# Patient Record
Sex: Male | Born: 1935 | Race: Black or African American | Hispanic: No | Marital: Married | Smoking: Former smoker
Health system: Southern US, Community
[De-identification: ages and names within clinical notes are randomized; demographics above are authoritative.]

## PROBLEM LIST (undated history)

## (undated) DIAGNOSIS — IMO0001 Reserved for inherently not codable concepts without codable children: Secondary | ICD-10-CM

## (undated) DIAGNOSIS — Z974 Presence of external hearing-aid: Secondary | ICD-10-CM

## (undated) DIAGNOSIS — K219 Gastro-esophageal reflux disease without esophagitis: Secondary | ICD-10-CM

## (undated) DIAGNOSIS — M1990 Unspecified osteoarthritis, unspecified site: Secondary | ICD-10-CM

## (undated) DIAGNOSIS — Z972 Presence of dental prosthetic device (complete) (partial): Secondary | ICD-10-CM

## (undated) DIAGNOSIS — R42 Dizziness and giddiness: Secondary | ICD-10-CM

## (undated) DIAGNOSIS — J45909 Unspecified asthma, uncomplicated: Secondary | ICD-10-CM

## (undated) DIAGNOSIS — J189 Pneumonia, unspecified organism: Secondary | ICD-10-CM

## (undated) DIAGNOSIS — Z9109 Other allergy status, other than to drugs and biological substances: Secondary | ICD-10-CM

## (undated) DIAGNOSIS — I10 Essential (primary) hypertension: Secondary | ICD-10-CM

## (undated) HISTORY — PX: HERNIA REPAIR: SHX51

## (undated) HISTORY — PX: KNEE ARTHROSCOPY: SHX127

## (undated) HISTORY — PX: CYSTOSCOPY: SUR368

## (undated) HISTORY — PX: BACK SURGERY: SHX140

---

## 1998-01-31 ENCOUNTER — Emergency Department (HOSPITAL_COMMUNITY): Admission: EM | Admit: 1998-01-31 | Discharge: 1998-01-31 | Payer: Self-pay | Admitting: Emergency Medicine

## 1998-11-18 ENCOUNTER — Emergency Department (HOSPITAL_COMMUNITY): Admission: EM | Admit: 1998-11-18 | Discharge: 1998-11-18 | Payer: Self-pay | Admitting: Emergency Medicine

## 2001-11-24 ENCOUNTER — Encounter: Payer: Self-pay | Admitting: Emergency Medicine

## 2001-11-24 ENCOUNTER — Emergency Department (HOSPITAL_COMMUNITY): Admission: EM | Admit: 2001-11-24 | Discharge: 2001-11-24 | Payer: Self-pay | Admitting: Emergency Medicine

## 2002-01-06 ENCOUNTER — Encounter: Payer: Self-pay | Admitting: Family Medicine

## 2002-01-06 ENCOUNTER — Emergency Department (HOSPITAL_COMMUNITY): Admission: EM | Admit: 2002-01-06 | Discharge: 2002-01-06 | Payer: Self-pay | Admitting: Emergency Medicine

## 2003-01-07 ENCOUNTER — Ambulatory Visit (HOSPITAL_COMMUNITY): Admission: RE | Admit: 2003-01-07 | Discharge: 2003-01-07 | Payer: Self-pay | Admitting: Gastroenterology

## 2003-01-07 ENCOUNTER — Encounter (INDEPENDENT_AMBULATORY_CARE_PROVIDER_SITE_OTHER): Payer: Self-pay | Admitting: Specialist

## 2005-10-19 ENCOUNTER — Emergency Department (HOSPITAL_COMMUNITY): Admission: EM | Admit: 2005-10-19 | Discharge: 2005-10-19 | Payer: Self-pay | Admitting: Emergency Medicine

## 2006-09-09 ENCOUNTER — Encounter (INDEPENDENT_AMBULATORY_CARE_PROVIDER_SITE_OTHER): Payer: Self-pay | Admitting: *Deleted

## 2006-09-09 ENCOUNTER — Inpatient Hospital Stay (HOSPITAL_COMMUNITY): Admission: RE | Admit: 2006-09-09 | Discharge: 2006-09-11 | Payer: Self-pay

## 2007-11-29 ENCOUNTER — Emergency Department (HOSPITAL_COMMUNITY): Admission: EM | Admit: 2007-11-29 | Discharge: 2007-11-29 | Payer: Self-pay | Admitting: Emergency Medicine

## 2009-01-18 ENCOUNTER — Emergency Department (HOSPITAL_COMMUNITY): Admission: EM | Admit: 2009-01-18 | Discharge: 2009-01-18 | Payer: Self-pay | Admitting: Emergency Medicine

## 2009-01-23 ENCOUNTER — Ambulatory Visit (HOSPITAL_COMMUNITY): Admission: EM | Admit: 2009-01-23 | Discharge: 2009-01-24 | Payer: Self-pay | Admitting: Emergency Medicine

## 2009-01-24 ENCOUNTER — Encounter (INDEPENDENT_AMBULATORY_CARE_PROVIDER_SITE_OTHER): Payer: Self-pay | Admitting: Gastroenterology

## 2011-01-20 LAB — CBC
HCT: 39.5 % (ref 39.0–52.0)
Hemoglobin: 13.7 g/dL (ref 13.0–17.0)
MCHC: 34.8 g/dL (ref 30.0–36.0)
RDW: 13.8 % (ref 11.5–15.5)

## 2011-01-20 LAB — DIFFERENTIAL
Basophils Absolute: 0 10*3/uL (ref 0.0–0.1)
Basophils Relative: 1 % (ref 0–1)
Eosinophils Relative: 4 % (ref 0–5)
Monocytes Absolute: 0.4 10*3/uL (ref 0.1–1.0)

## 2011-01-20 LAB — POCT CARDIAC MARKERS: Troponin i, poc: <0.05 ng/mL (ref 0.00–0.09)

## 2011-02-23 NOTE — Consult Note (Signed)
NAME:  Stephen Hawkins, Stephen Hawkins NO.:  [id number]   MEDICAL RECORD NO.:  [id number]          PATIENT TYPE:  EMS   LOCATION:  ED                           FACILITY:  [facility]   PHYSICIAN:  Pedro Earls, MD     DATE OF BIRTH:  03-Jan-1931   DATE OF CONSULTATION:  01/18/2009  DATE OF DISCHARGE:                                 CONSULTATION   REQUESTING PHYSICIAN:  The ER.   REASON FOR CONSULTATION:  Shortness of breath.   HISTORY OF PRESENT ILLNESS:  This is a 77 year old African American male  patient with a past medical history significant for asthma who was  apparently asymptomatic approximately a week ago when he started doing  some gardening and some yard work.  Since then the patient started  having some cough and some shortness of breath and wheezing.  The  patient had been taking his albuterol inhaler 2 puffs as needed and  Singulair but it is not helping at this point and the patient started  coughing and bringing up yellow expectoration as well.  The patient does  not normally have  seasonal allergies, does not smoke and has never had  any acute attack in the past which required hospitalization.  The patient denies any nausea, vomiting, any fever or chills, any chest  pain.   REVIEW OF SYSTEMS:  As above.  The rest of the review of systems are  negative.   PAST MEDICAL HISTORY:  1. Degenerative joint disease.  2. Asthma.   PAST SURGICAL HISTORY:  1. Hemorrhoidectomy.  2. Prostatectomy.  3. Hernia repair.   SOCIAL HISTORY:  Nonsmoker, nonalcoholic, no IV drug abuser.   FAMILY HISTORY:  Noncontributory.   MEDICATIONS:  1. Singulair 10 mg daily.  2. Albuterol 2 puffs as needed.   ALLERGIES:  NO KNOWN DRUG ALLERGIES.Marland Kitchen   PHYSICAL EXAMINATION:  VITAL SIGNS:  Temperature 98.3, blood pressure  133/61, pulse is 67-70, respirations 20, pulse ox 96-98% on 2 liters.  GENERAL:  The patient is awake, alert, oriented x3.  Does not appear to  be in acute distress.  HEENT:  Pupils equal, round, reactive to light.  No icterus, no pallor.  Extraocular movements are intact.  Oral mucosa is moist.  NECK:  Supple.  No JVD, no lymphadenopathy.  HEART:  S1 and S2, regular.  CHEST:  Clear.  Minimal wheezing present.  ABDOMEN:  Soft, obese, nontender.  Bowel sounds present.  No  hepatosplenomegaly.  EXTREMITIES:  Peripheral pulses present.  No clubbing, cyanosis or  edema.  CNS:  Sensory grossly intact.  Cranial nerves II-XII intact.  SKIN: No rashes.  MUSCULOSKELETAL:  Unremarkable.   LABORATORY DATA:  Chest x-ray negative.  EKG shows normal sinus rhythm,  no acute ST-T wave changes were seen.  Last CBC is within normal range.  Troponin less than 0.05.   IMPRESSION:  Acute asthmatic bronchitis.   RECOMMENDATIONS:  1. The patient should be given some breathing treatment in the ER.  I      would recommend that the patient can be discharged home after      breathing  treatment has been done and the patient needs to follow      with the PCP on January 20, 2009.  2. Recommend Levaquin 750 p.o. daily for 5 days with Medrol Dosepak      along with Advair 110/50 1 puff b.i.d.   Thank you for this consultation.      Pedro Earls, MD  Electronically Signed     NS/MEDQ  D:  01/18/2009  T:  01/18/2009  Job:  [id number]   cc:   Lorelle Formosa, M.D.  Fax: [phone]

## 2011-02-23 NOTE — Consult Note (Signed)
NAME:  Stephen Hawkins, Stephen Hawkins NO.:  [id number]   MEDICAL RECORD NO.:  [id number]          PATIENT TYPE:  EMS   LOCATION:  ED                           FACILITY:  [facility]   PHYSICIAN:  Jordan Hawks. Elnoria Howard, MD    DATE OF BIRTH:  1931-09-30   DATE OF CONSULTATION:  01/24/2009  DATE OF DISCHARGE:                                 CONSULTATION   REASON FOR CONSULTATION:  Food impaction.   HISTORY OF PRESENT ILLNESS:  This is a 77 year old gentleman with past  medical history of asthma, who reports having an acute onset of  dysphagia.  He was eating a piece of rib, and unfortunately at that time  he did not have his dentures in.  He was not able to expectorate his  food bolus before chewing it properly.  Subsequently it was lodged in  his esophagus.  Since that time he has not been able to manage his  secretions, and subsequently presented to the emergency room for further  evaluation and treatment.  No prior reports of gastroesophageal reflux  disease and no prior history of dysphagia in the past.   PAST MEDICAL HISTORY:  As stated above.   PAST SURGICAL HISTORY:  As stated above.   FAMILY HISTORY:  Noncontributory.   SOCIAL HISTORY:  Negative for alcohol, tobacco or illicit drug use.   HOME MEDICATIONS:  1. Albuterol.  2. Singulair.   ALLERGIES:  No known drug allergies.   REVIEW OF SYSTEMS:  As stated above in the history of present illness,  otherwise negative.   PHYSICAL EXAMINATION:  VITAL SIGNS:  Stable.  GENERAL:  The patient is in no acute distress, alert and oriented.  HEENT:  Normocephalic and atraumatic.  Extraocular muscles intact.  NECK:  Supple.  No lymphadenopathy.  LUNGS:  Clear to auscultation bilaterally.  CARDIOVASCULAR:  Regular rate and rhythm.  ABDOMEN:  Obese, soft, nontender and nondistended.  Positive bowel  sounds.  EXTREMITIES:  No clubbing, cyanosis or edema.   IMPRESSION:  Acute food impaction:  The patient has a negative history  of  reflux and no prior dysphagia symptoms.  At this time I will perform  an endoscopy for further evaluation and remove the food bolus.  Further  recommendations pending the findings.      Jordan Hawks Elnoria Howard, MD  Electronically Signed     PDH/MEDQ  D:  01/24/2009  T:  01/24/2009  Job:  [id number]

## 2011-02-26 NOTE — Op Note (Signed)
NAME:  Stephen Hawkins, Stephen Hawkins NO.:  [id number]   MEDICAL RECORD NO.:  [id number]          PATIENT TYPE:  INP   LOCATION:  0002                         FACILITY:  [facility]   PHYSICIAN:  Lindaann Slough, M.D.  DATE OF BIRTH:  1930/12/17   DATE OF PROCEDURE:  09/09/2006  DATE OF DISCHARGE:                               OPERATIVE REPORT   PREOPERATIVE DIAGNOSES:  1. Benign prostatic hypertrophy.  2. Bladder outlet obstruction.  3. Left inguinal hernia.   POSTOPERATIVE DIAGNOSES:  1. Benign prostatic hypertrophy.  2. Bladder outlet obstruction.  3. Left inguinal hernia.   PROCEDURE DONE:  1. Cystoscopy.  2. TURP.   SURGEON:  Dr. Brunilda Payor   ANESTHESIA:  Spinal.   INDICATION:  The patient is a 77 year old male, who has been complaining  of increasing symptoms of frequency, urgency, decreased force of the  urinary strean, nocturia x2-3.  He has been on Flomax and Avodart, and  he has not had any improvement of the symptoms.  He had a TUNA procedure  on July 29, 2003.  The patient was found on examination to have a  left inguinal hernia.  He is scheduled for cystoscopy, TURP, and also  left inguinal hernia repair by Dr. Orson Slick.   Under spinal anesthesia, the patient was prepped and draped and placed  in the dorsal lithotomy position.  A #22 Wappler cystoscope was inserted  in the bladder.  The patient has trilobar prostatic hypertrophy with  obstruction of the bladder neck.  He has a large median lobe.  The  bladder is trabeculated with cellules.  There is no stone or tumor in  the bladder.  The ureteral orifices are in normal position and shape.  The cystoscope was removed.  The urethra was dilated up to #30 [place]  with Sissy Hoff sounds.  Then, a #28 continuous-flow Iglesias  resectoscope was inserted in the bladder.  Resection of the median lobe  was done first.  Then resection of the lateral lobes was done between  the 7 and the 10 o'clock position and between the 2  o'clock and the 5  o'clock position  using the bladder neck  and the verumontanum as  landmarks.  Then, the resection was completed between the 10 o'clock and  the 2 o'clock positions and between the 5 o'clock and 7o'clock positions  using the same landmarks.  Hemostasis was secured with electrocautery.  The prostatic chips were then irrigated out of the bladder.  There was  minimal bleeding  at the end of the procedure.  The resectoscope was then removed.  A #24  three-way Foley catheter was inserted in the bladder.   The patient tolerated the procedure well and left the OR in satisfactory  condition to postanesthesia care unit.      Lindaann Slough, M.D.  Electronically Signed     MN/MEDQ  D:  09/09/2006  T:  09/10/2006  Job:  [id number]   cc:   Lebron Conners, M.D.  [address]  [place]  [place] [address]

## 2011-02-26 NOTE — Op Note (Signed)
NAME:  Stephen Hawkins, Stephen Hawkins NO.:  [id number]   MEDICAL RECORD NO.:  [id number]          PATIENT TYPE:  INP   LOCATION:  0002                         FACILITY:  [facility]   PHYSICIAN:  Lebron Conners, M.D.   DATE OF BIRTH:  Nov 18, 1930   DATE OF PROCEDURE:  09/09/2006  DATE OF DISCHARGE:                               OPERATIVE REPORT   PREOPERATIVE DIAGNOSIS:  Left inguinal hernia   POSTOPERATIVE DIAGNOSIS:  Indirect left inguinal hernia.   SURGEON:  Lebron Conners, M.D.   ANESTHESIA:  Spinal and local.   ESTIMATED BLOOD LOSS:  Minimal.   COMPLICATIONS:  None.   SPECIMEN:  None.   PROCEDURE:  After the patient was monitored and had adequate spinal  anesthesia and routine preparation and draping of the left inguinal  region using Hibiclens, I made an oblique incision about 7 cm in length  beginning just above and lateral to the pubic tubercle and dissecting  down through the fat to the external oblique.  I identified the  superficial inguinal ring and opened the fibers of the external oblique  in the direction of their travel into the superficial ring exposing  expanded spermatic cord.  I identified the ilioinguinal nerve and spared  it from harm.  I dissected down around the cord at the level of the  pubic tubercle and encircled it with a Penrose drain and lifted it up.  I then cut the cremaster fibers along the medial inguinal floor until I  had the cord coming straight out of the deep ring.  I saw a slight  tendency for weakness of the medial floor but there was no actual  hernia.  I then dissected the proximal aspect of the cord, finding a  large hernia sac and large amount of fat associated with it.  I  separated that from the vas deferens and vessels of the cord.  I reduced  it through the deep ring and held it in place with a 2-0 Vicryl stitch  placed generously in the internal oblique to hold it in place and  tighten the superior aspect of the deep ring.  I  then fashioned a piece  of polypropylene mesh cut with a slit in it to allow the spermatic cord  to exit from the belly wall into the scrotum and I sewed that in place  beginning at the pubic tubercle and sewing medially and superiorly with  running basting 2-0 Prolene stitch and laterally and inferiorly with  running simple stitch in the shelving edge of the inguinal ligament.  I  used two Prolene stitches lateral to the deep ring to join the tails of  the mesh together completing the repair.  I then generously anesthetized  the operative area with long acting local anesthetic.  I closed the  external oblique with running 2-0 Vicryl and closed subcutaneous tissues  with running 3-0  Vicryl and closed the skin with intracuticular 4-0 Vicryl and Steri-  Strips.  The sponge, needle and instrument counts were correct.  The  patient was stable throughout the procedure and remained in the  operating room  for Dr. Brunilda Payor to do a urological procedure.      Lebron Conners, M.D.  Electronically Signed     WB/MEDQ  D:  09/09/2006  T:  09/09/2006  Job:  [id number]

## 2011-02-26 NOTE — Op Note (Signed)
NAME:  Stephen Hawkins, Stephen Hawkins                        ACCOUNT NO.:  [id number]   MEDICAL RECORD NO.:  [id number]                   PATIENT TYPE:  AMB   LOCATION:  ENDO                                 FACILITY:  [facility]   PHYSICIAN:  Petra Kuba, M.D.                 DATE OF BIRTH:  August 08, 1931   DATE OF PROCEDURE:  01/07/2003  DATE OF DISCHARGE:                                 OPERATIVE REPORT   PROCEDURE PERFORMED:  Colonoscopy.   INDICATIONS FOR PROCEDURE:  Bright red blood per rectum in a patient due for  colonic screening.   Consent was signed after the risks, benefits, methods and options were  thoroughly discussed in the office.   PREMEDICATION:  Demerol 80, Versed 8.   DESCRIPTION OF PROCEDURE:  The rectal was inspected.  It was remarkable for  internal hemorrhoids, small.  A digital exam was negative.  The video  colonoscope was inserted, easily advanced around the colon to the cecum.  On  insertion, a moderate sized pedunculated proximal sigmoid polyp was seen but  no other obvious abnormalities.  The cecum was identified by the appendiceal  orifice and the ileocecal valve.  The scope was slowly withdrawn.  The prep  was adequate.  There was some liquid stool that required washing and  suctioning on slow withdrawal.  The cecum and ascending were normal.  In the  hepatic flexure, a tiny small polyp was seen and was hot biopsied x1.  There  was a rare right and left-sided diverticula that was seen as we slowly  withdrew back to the polyp seen on insertion which was seen, snared,  electrocautery applied and the polyp was removed.  It was resnared,  suctioned onto the head of the scope and the polyp was withdrawn.  The scope  was reinserted to the level of the polypectomy site.  The stalk had a nice  white coagulum without any obvious residual polyps.  The scope was further  withdrawn.  No additional findings were seen.  Once back in the rectum, the  scope was retroflexed.   There were some internal hemorrhoids.  The scope was straightened and re-  advanced a ways up the left side of the colon.  The air was suctioned.  The  scope was removed.  The patient tolerated the procedure well.  There were no  obvious immediate complications.   ENDOSCOPIC DIAGNOSES:  1. Internal and external hemorrhoids.  2. Rare left and right diverticula.  3. Moderate sigmoid proximal polyp status post snare.  4. Tiny hepatic flexure polyp, hot biopsied.  5. Otherwise within normal limits to the cecum.    PLAN:  Await pathology but probably recheck colon screening in three years,  happy to see back p.r.n., otherwise see back in about two months to recheck  symptoms, possibly guaiacs and make sure no further work up plans are  needed.  Petra Kuba, M.D.    MEM/MEDQ  D:  01/07/2003  T:  01/07/2003  Job:  [id number]   cc:   Lorelle Formosa, M.D.  [phone]  [place]  [place] [address]  Fax: [phone]

## 2011-02-26 NOTE — H&P (Signed)
NAME:  Stephen Hawkins, GOELLER NO.:  [id number]   MEDICAL RECORD NO.:  [id number]          PATIENT TYPE:  INP   LOCATION:  0002                         FACILITY:  [facility]   PHYSICIAN:  Lindaann Slough, M.D.  DATE OF BIRTH:  20-Oct-1930   DATE OF ADMISSION:  09/09/2006  DATE OF DISCHARGE:                              HISTORY & PHYSICAL   CHIEF COMPLAINT:  Frequency, urgency, hesitancy, decreased force of  urinary stream.   HISTORY OF PRESENT ILLNESS:  The patient is a 77 year old male who has  been having increasing symptoms of frequency, urgency, hesitancy,  decreased force of urinary stream.  He has been on Flomax  2 capsules  and Avodart, and he has not had any improvement of his symptoms.  He had  a TUNA procedure on July 29, 2003.  The patient is admitted for TURP.   PAST MEDICAL HISTORY:  Positive for asthma.   MEDICATIONS:  Albuterol, Clarinex, Singulair, doxazosin, Avodart 0.5 mg.   ALLERGIES:  NO KNOWN DRUG ALLERGIES.   PAST SURGICAL HISTORY:  Right knee surgery in 1956.   SOCIAL HISTORY:  He is married, has 5 children.  Quit smoking in 1970  and had smoked for about 5 years, does not drink.   FAMILY HISTORY:  Positive for hypertension, heart disease.  One brother  died of prostate cancer.  He had 4 brothers, all deceased, and he had 5  sisters, 2 of whom are deceased.   REVIEW OF SYSTEMS:  Positive for frequency, decreased force of the  urinary stream, nocturia x3, urgency, and all others are negative.   PHYSICAL EXAMINATION:  GENERAL:  This is a well-built 77 year old male  who is in no acute distress.  He is oriented to time, place and person.  VITAL SIGNS:  Blood pressure is 123/77, pulse 64, respirations 20, and  temperature 97.5.  HEENT:  Normal.  Pupils are equal and reactive to light and  accommodation.  Ears, nose, and throat within normal limits.  He has  pink conjunctivae.  He has no cervical adenopathy, no thyromegaly.  LUNGS:  Clear.  HEART:  Regular rate.  ABDOMEN:  Soft, nondistended, nontender.  He has no CVA tenderness.  Kidneys are not palpable.  He has no hepatomegaly, no splenomegaly.  Bladder is not distended.  There is a left inguinal hernia, and bowel  sounds are normal.  GENITALIA:  Penis and scrotal contents are within normal limits.  RECTAL:  Sphincter tone is normal.  Prostate is enlarged 40 g, nodules.  Seminal vesicles not palpable.   IMPRESSION:  1. Benign prostatic hypertrophy.  2. Bladder outlet obstruction.  3. Left inguinal hernia.  4. Asthma.      Lindaann Slough, M.D.  Electronically Signed     MN/MEDQ  D:  09/09/2006  T:  09/10/2006  Job:  [id number]

## 2012-04-07 ENCOUNTER — Ambulatory Visit (INDEPENDENT_AMBULATORY_CARE_PROVIDER_SITE_OTHER): Payer: TRICARE For Life (TFL) | Admitting: Internal Medicine

## 2012-04-07 ENCOUNTER — Ambulatory Visit: Payer: BC Managed Care – PPO

## 2012-04-07 VITALS — BP 166/69 | HR 73 | Temp 98.5°F | Resp 18 | Ht 70.5 in | Wt 242.0 lb

## 2012-04-07 DIAGNOSIS — M25569 Pain in unspecified knee: Secondary | ICD-10-CM

## 2012-04-07 DIAGNOSIS — H6120 Impacted cerumen, unspecified ear: Secondary | ICD-10-CM

## 2012-04-07 DIAGNOSIS — M1990 Unspecified osteoarthritis, unspecified site: Secondary | ICD-10-CM

## 2012-04-07 NOTE — Progress Notes (Signed)
  Subjective:    Patient ID: Stephen Hawkins, male    DOB: 12/13/1930, 78 y.o.   MRN: [id number]  HPIPatient of Dr. Jeri Cos Sent here by Dr. Bascom Levels for an injection of his knee Complaining of increased pain in the knee with ambulation over the last 6-12 months/there is occasional swelling/never red/has history of right knee problems and needed arthroscopy at 1 point     Review of SystemsDenies any health problems except allergic rhinitis and asthma/both stable No fever or chills no history of gout No arthritis in other joints     Objective:   Physical Exam Blood pressure 166/69 Right knee is puffy/very tender over medial joint line/mildly tender over lateral joint line/has pain with full flexion/okay full extension/varus and valgus are stable/anterior drawer stable/there is no redness  Procedure= with sterile technique,After 2 cc of extra-articular lidocaine, aspiration was attempted and was a dry tap Injected with 1 cc Kenalog 40 mg +3 cc of lidocaine Ace to compress      [facility] reading (PRIMARY) by  Dr.Latayvia Mandujano=Narrow at the medial joint line   Assessment & Plan:  Problem #1 osteoarthritis knee  Postinjection care discussed with advancing of weightbearing activities over the next wee Followup with Dr. Bascom Levels

## 2012-04-07 NOTE — Patient Instructions (Addendum)
Rest the knee over the next 24 hours Apply ice for 30 minutes every 2 hours for the rest of today If there is any redness or swelling we need to reevaluate your knee at once Use Tylenol or Aleve for discomfort over the next 48 hours

## 2012-04-08 ENCOUNTER — Encounter: Payer: Self-pay | Admitting: Internal Medicine

## 2012-06-27 ENCOUNTER — Emergency Department (INDEPENDENT_AMBULATORY_CARE_PROVIDER_SITE_OTHER)
Admission: EM | Admit: 2012-06-27 | Discharge: 2012-06-27 | Disposition: A | Discharge status: Nursing Home (Includes ICF) | Payer: BC Managed Care – PPO | Source: Home / Self Care | Attending: Family Medicine | Admitting: Family Medicine

## 2012-06-27 ENCOUNTER — Emergency Department (INDEPENDENT_AMBULATORY_CARE_PROVIDER_SITE_OTHER): Payer: BC Managed Care – PPO

## 2012-06-27 ENCOUNTER — Observation Stay (HOSPITAL_COMMUNITY)
Admission: EM | Admit: 2012-06-27 | Discharge: 2012-06-29 | DRG: 143 | Disposition: A | Discharge status: Home / Self Care | Payer: BC Managed Care – PPO | Attending: Internal Medicine | Admitting: Internal Medicine

## 2012-06-27 ENCOUNTER — Encounter (HOSPITAL_COMMUNITY): Payer: Self-pay | Admitting: *Deleted

## 2012-06-27 DIAGNOSIS — M25519 Pain in unspecified shoulder: Secondary | ICD-10-CM | POA: Insufficient documentation

## 2012-06-27 DIAGNOSIS — M25512 Pain in left shoulder: Secondary | ICD-10-CM

## 2012-06-27 DIAGNOSIS — M542 Cervicalgia: Secondary | ICD-10-CM | POA: Insufficient documentation

## 2012-06-27 DIAGNOSIS — R609 Edema, unspecified: Secondary | ICD-10-CM | POA: Insufficient documentation

## 2012-06-27 DIAGNOSIS — R0600 Dyspnea, unspecified: Secondary | ICD-10-CM

## 2012-06-27 DIAGNOSIS — K219 Gastro-esophageal reflux disease without esophagitis: Secondary | ICD-10-CM | POA: Diagnosis present

## 2012-06-27 DIAGNOSIS — R001 Bradycardia, unspecified: Secondary | ICD-10-CM | POA: Diagnosis present

## 2012-06-27 DIAGNOSIS — J45909 Unspecified asthma, uncomplicated: Secondary | ICD-10-CM | POA: Diagnosis present

## 2012-06-27 DIAGNOSIS — R079 Chest pain, unspecified: Principal | ICD-10-CM | POA: Diagnosis present

## 2012-06-27 DIAGNOSIS — E876 Hypokalemia: Secondary | ICD-10-CM | POA: Diagnosis present

## 2012-06-27 DIAGNOSIS — R0602 Shortness of breath: Secondary | ICD-10-CM | POA: Insufficient documentation

## 2012-06-27 DIAGNOSIS — R1012 Left upper quadrant pain: Secondary | ICD-10-CM | POA: Insufficient documentation

## 2012-06-27 DIAGNOSIS — R0609 Other forms of dyspnea: Secondary | ICD-10-CM

## 2012-06-27 DIAGNOSIS — I498 Other specified cardiac arrhythmias: Secondary | ICD-10-CM | POA: Insufficient documentation

## 2012-06-27 DIAGNOSIS — I10 Essential (primary) hypertension: Secondary | ICD-10-CM | POA: Diagnosis present

## 2012-06-27 DIAGNOSIS — Z87891 Personal history of nicotine dependence: Secondary | ICD-10-CM | POA: Insufficient documentation

## 2012-06-27 HISTORY — DX: Unspecified asthma, uncomplicated: J45.909

## 2012-06-27 LAB — CBC
HCT: 36.2 % — ABNORMAL LOW (ref 39.0–52.0)
MCH: 29.7 pg (ref 26.0–34.0)
MCHC: 34.3 g/dL (ref 30.0–36.0)
MCV: 86.6 fL (ref 78.0–100.0)
RDW: 13.6 % (ref 11.5–15.5)

## 2012-06-27 LAB — CK TOTAL AND CKMB (NOT AT ARMC)
Relative Index: 1.6 (ref 0.0–2.5)
Total CK: 523 U/L — ABNORMAL HIGH (ref 7–232)

## 2012-06-27 LAB — CBC WITH DIFFERENTIAL/PLATELET
Basophils Relative: 0 % (ref 0–1)
HCT: 37.3 % — ABNORMAL LOW (ref 39.0–52.0)
Hemoglobin: 12.9 g/dL — ABNORMAL LOW (ref 13.0–17.0)
MCH: 29.9 pg (ref 26.0–34.0)
MCHC: 34.6 g/dL (ref 30.0–36.0)
Monocytes Absolute: 0.5 10*3/uL (ref 0.1–1.0)
Monocytes Relative: 10 % (ref 3–12)
Neutro Abs: 2.1 10*3/uL (ref 1.7–7.7)

## 2012-06-27 LAB — BASIC METABOLIC PANEL
BUN: 12 mg/dL (ref 6–23)
Chloride: 105 mEq/L (ref 96–112)
Creatinine, Ser: 0.92 mg/dL (ref 0.50–1.35)
GFR calc Af Amer: >90 mL/min (ref >90)

## 2012-06-27 LAB — CREATININE, SERUM: GFR calc non Af Amer: 77 mL/min — ABNORMAL LOW (ref >90)

## 2012-06-27 LAB — PRO B NATRIURETIC PEPTIDE: Pro B Natriuretic peptide (BNP): 60.9 pg/mL (ref 0–450)

## 2012-06-27 MED ORDER — LISINOPRIL 10 MG PO TABS
10.0000 mg | ORAL_TABLET | Freq: Every day | ORAL | Status: DC
  Administered 2012-06-27: 10 mg via ORAL
  Filled 2012-06-27 (×2): qty 1

## 2012-06-27 MED ORDER — HYDROCODONE-ACETAMINOPHEN 5-325 MG PO TABS: 1.0000 | ORAL_TABLET | ORAL | Status: DC | PRN

## 2012-06-27 MED ORDER — SODIUM CHLORIDE 0.9 % IV SOLN: 250.0000 mL | INTRAVENOUS | Status: DC | PRN

## 2012-06-27 MED ORDER — SODIUM CHLORIDE 0.9 % IJ SOLN
3.0000 mL | Freq: Two times a day (BID) | INTRAMUSCULAR | Status: DC
  Administered 2012-06-27 – 2012-06-28 (×3): 3 mL via INTRAVENOUS

## 2012-06-27 MED ORDER — SODIUM CHLORIDE 0.9 % IV SOLN
Freq: Once | INTRAVENOUS | Status: AC
  Administered 2012-06-27: 16:00:00 via INTRAVENOUS

## 2012-06-27 MED ORDER — ASPIRIN 325 MG PO TABS
325.0000 mg | ORAL_TABLET | Freq: Once | ORAL | Status: AC
  Administered 2012-06-27: 325 mg via ORAL
  Filled 2012-06-27: qty 1

## 2012-06-27 MED ORDER — IPRATROPIUM BROMIDE 0.02 % IN SOLN
0.5000 mg | Freq: Four times a day (QID) | RESPIRATORY_TRACT | Status: DC
  Administered 2012-06-27: 0.5 mg via RESPIRATORY_TRACT
  Filled 2012-06-27: qty 2.5

## 2012-06-27 MED ORDER — ENOXAPARIN SODIUM 40 MG/0.4ML ~~LOC~~ SOLN
40.0000 mg | SUBCUTANEOUS | Status: DC
  Administered 2012-06-27 – 2012-06-28 (×2): 40 mg via SUBCUTANEOUS
  Filled 2012-06-27 (×3): qty 0.4

## 2012-06-27 MED ORDER — MONTELUKAST SODIUM 10 MG PO TABS
10.0000 mg | ORAL_TABLET | Freq: Every day | ORAL | Status: DC
  Administered 2012-06-27 – 2012-06-28 (×2): 10 mg via ORAL
  Filled 2012-06-27 (×3): qty 1

## 2012-06-27 MED ORDER — SODIUM CHLORIDE 0.9 % IJ SOLN: 3.0000 mL | INTRAMUSCULAR | Status: DC | PRN

## 2012-06-27 MED ORDER — ASPIRIN EC 325 MG PO TBEC
325.0000 mg | DELAYED_RELEASE_TABLET | Freq: Every day | ORAL | Status: DC
  Administered 2012-06-28 – 2012-06-29 (×2): 325 mg via ORAL
  Filled 2012-06-27 (×3): qty 1

## 2012-06-27 MED ORDER — ALBUTEROL SULFATE (5 MG/ML) 0.5% IN NEBU
2.5000 mg | INHALATION_SOLUTION | Freq: Four times a day (QID) | RESPIRATORY_TRACT | Status: DC
  Administered 2012-06-27: 2.5 mg via RESPIRATORY_TRACT
  Filled 2012-06-27: qty 0.5

## 2012-06-27 MED ORDER — HYDROCHLOROTHIAZIDE 12.5 MG PO CAPS
12.5000 mg | ORAL_CAPSULE | Freq: Every day | ORAL | Status: DC
  Administered 2012-06-27: 12.5 mg via ORAL
  Filled 2012-06-27 (×2): qty 1

## 2012-06-27 MED ORDER — POTASSIUM CHLORIDE CRYS ER 20 MEQ PO TBCR
40.0000 meq | EXTENDED_RELEASE_TABLET | Freq: Once | ORAL | Status: AC
  Administered 2012-06-27: 40 meq via ORAL
  Filled 2012-06-27: qty 2

## 2012-06-27 MED ORDER — SODIUM CHLORIDE 0.9 % IJ SOLN
INTRAMUSCULAR | Status: AC
  Filled 2012-06-27: qty 3

## 2012-06-27 MED ORDER — PANTOPRAZOLE SODIUM 40 MG PO TBEC
40.0000 mg | DELAYED_RELEASE_TABLET | Freq: Every day | ORAL | Status: DC
  Administered 2012-06-27 – 2012-06-29 (×3): 40 mg via ORAL
  Filled 2012-06-27 (×3): qty 1

## 2012-06-27 MED ORDER — NITROGLYCERIN 0.4 MG SL SUBL: 0.4000 mg | SUBLINGUAL_TABLET | SUBLINGUAL | Status: DC | PRN

## 2012-06-27 MED ORDER — ALBUTEROL SULFATE HFA 108 (90 BASE) MCG/ACT IN AERS
4.0000 | INHALATION_SPRAY | RESPIRATORY_TRACT | Status: DC | PRN
  Administered 2012-06-27: 4 via RESPIRATORY_TRACT
  Filled 2012-06-27: qty 6.7

## 2012-06-27 MED ORDER — ALUM & MAG HYDROXIDE-SIMETH 200-200-20 MG/5ML PO SUSP
30.0000 mL | Freq: Four times a day (QID) | ORAL | Status: AC
  Administered 2012-06-27 – 2012-06-28 (×3): 30 mL via ORAL
  Filled 2012-06-27 (×3): qty 30

## 2012-06-27 NOTE — ED Notes (Signed)
Report called to charge nurse - gems otw

## 2012-06-27 NOTE — ED Provider Notes (Signed)
History     CSN: [id number]  Arrival date & time 06/27/12  1334   First MD Initiated Contact with Patient 06/27/12 1339      Chief Complaint  Patient presents with  . Chest Pain    (Consider location/radiation/quality/duration/timing/severity/associated sxs/prior treatment) Patient is a 78 y.o. male presenting with chest pain. The history is provided by the patient.  Chest Pain The chest pain began 5 - 7 days ago. Chest pain occurs constantly. The pain is associated with breathing. The pain is currently at 5/10. The quality of the pain is described as tightness. The pain radiates to the left neck. Chest pain is worsened by deep breathing. Primary symptoms include shortness of breath and wheezing. Associated symptoms comments: Reflux burping sx but no complete resolution of sx. Past medical history comments: asthma     Past Medical History  Diagnosis Date  . Asthma     Past Surgical History  Procedure Date  . Hernia repair   . Knee arthroscopy     knee surgery in the army    History reviewed. No pertinent family history.  History  Substance Use Topics  . Smoking status: Former Games developer  . Smokeless tobacco: Not on file  . Alcohol Use: No      Review of Systems  Constitutional: Negative.   Respiratory: Positive for shortness of breath and wheezing.   Cardiovascular: Positive for chest pain.    Allergies  Review of patient's allergies indicates no known allergies.  Home Medications   No current outpatient prescriptions on file.  BP 163/79  Pulse 70  Temp 97.7 F (36.5 C) (Oral)  Resp 20  SpO2 97%  Physical Exam  Nursing note and vitals reviewed. Constitutional: He is oriented to person, place, and time. He appears well-developed and well-nourished.  HENT:  Head: Normocephalic.  Eyes: Pupils are equal, round, and reactive to light.  Neck: Normal range of motion. Neck supple.  Cardiovascular: Normal rate, regular rhythm, normal heart sounds and intact  distal pulses.   Pulmonary/Chest: Effort normal. He has decreased breath sounds. He has no rales.  Abdominal: Bowel sounds are normal.  Lymphadenopathy:    He has no cervical adenopathy.  Neurological: He is alert and oriented to person, place, and time.  Skin: Skin is warm and dry.    ED Course  Procedures (including critical care time)  Labs Reviewed - No data to display Dg Chest 2 View  06/27/2012  *RADIOLOGY REPORT*  Clinical Data: Left upper quadrant pain.  Shortness of breath.  CHEST - 2 VIEW  Comparison: Two-view chest 01/18/2009.  Findings: The heart size is upper limits of normal.  Lungs are hyperinflated.  There is a linear atelectasis are present at the lung bases.  The upper lung fields are clear.  The visualized soft tissues and bony thorax are unremarkable.  IMPRESSION:  1.  Hyperinflation compatible with emphysema. 2.  Areas of linear atelectasis are present at the lung bases bilaterally.   Original Report Authenticated By: Jamesetta Orleans. MATTERN, M.D.      1. Dyspnea and respiratory abnormalities       MDM  X-rays reviewed and report per radiologist.         Linna Hoff, MD 06/28/12 2006

## 2012-06-27 NOTE — ED Provider Notes (Signed)
History     CSN: [id number]  Arrival date & time 06/27/12  1554   First MD Initiated Contact with Patient 06/27/12 1613      Chief Complaint  Patient presents with  . Chest Pain    (Consider location/radiation/quality/duration/timing/severity/associated sxs/prior treatment) Patient is a 78 y.o. male presenting with chest pain. The history is provided by the patient.  Chest Pain The chest pain began more than 2 weeks ago. Episode Length: 3-4. Chest pain occurs constantly. The chest pain is unchanged. The pain is associated with eating and exertion. At its most intense, the pain is at 5/10. The pain is currently at 8/10. The severity of the pain is mild. The quality of the pain is described as aching and dull. Radiates to: left neck, left shoulder. Primary symptoms include shortness of breath (mild). Pertinent negatives for primary symptoms include no fever, no cough, no abdominal pain, no nausea and no vomiting.  Pertinent negatives for associated symptoms include no numbness. He tried nothing for the symptoms. Risk factors: FH.     Past Medical History  Diagnosis Date  . Asthma     History reviewed. No pertinent past surgical history.  No family history on file.  History  Substance Use Topics  . Smoking status: Former Games developer  . Smokeless tobacco: Not on file  . Alcohol Use: No      Review of Systems  Constitutional: Negative for fever.  HENT: Negative for rhinorrhea, drooling and neck pain.   Eyes: Negative for pain.  Respiratory: Positive for shortness of breath (mild). Negative for cough.   Cardiovascular: Positive for chest pain. Negative for leg swelling.  Gastrointestinal: Negative for nausea, vomiting, abdominal pain and diarrhea.  Genitourinary: Negative for dysuria and hematuria.  Musculoskeletal: Negative for gait problem.  Skin: Negative for color change.  Neurological: Negative for numbness and headaches.  Hematological: Negative for adenopathy.    Psychiatric/Behavioral: Negative for behavioral problems.  All other systems reviewed and are negative.    Allergies  Review of patient's allergies indicates no known allergies.  Home Medications   Current Outpatient Rx  Name Route Sig Dispense Refill  . ALBUTEROL SULFATE HFA 108 (90 BASE) MCG/ACT IN AERS Inhalation Inhale 2 puffs into the lungs every 6 (six) hours as needed. For wheezing    . IBUPROFEN 200 MG PO TABS Oral Take 400 mg by mouth daily as needed. For pain    . MONTELUKAST SODIUM 10 MG PO TABS Oral Take 10 mg by mouth at bedtime.      BP 159/68  Temp 98.3 F (36.8 C) (Oral)  Resp 16  SpO2 99%  Physical Exam  Nursing note and vitals reviewed. Constitutional: He is oriented to person, place, and time. He appears well-developed and well-nourished.  HENT:  Head: Normocephalic and atraumatic.  Right Ear: External ear normal.  Left Ear: External ear normal.  Nose: Nose normal.  Mouth/Throat: Oropharynx is clear and moist. No oropharyngeal exudate.  Eyes: Conjunctivae normal and EOM are normal. Pupils are equal, round, and reactive to light.  Neck: Normal range of motion. Neck supple.  Cardiovascular: Normal rate, regular rhythm, normal heart sounds and intact distal pulses.  Exam reveals no gallop and no friction rub.   No murmur heard. Pulmonary/Chest: Effort normal. No respiratory distress. He has wheezes (mild wheezing bilaterally in lung bases).  Abdominal: Soft. Bowel sounds are normal. He exhibits no distension. There is no tenderness. There is no rebound and no guarding.  Musculoskeletal: Normal range of motion.  He exhibits edema (mild nonpitting edema in bilatearl LE's). He exhibits no tenderness.  Neurological: He is alert and oriented to person, place, and time.  Skin: Skin is warm and dry.  Psychiatric: He has a normal mood and affect. His behavior is normal.    ED Course  Procedures (including critical care time)   Labs Reviewed  CBC WITH  DIFFERENTIAL  BASIC METABOLIC PANEL  TROPONIN I   Dg Chest 2 View  06/27/2012  *RADIOLOGY REPORT*  Clinical Data: Left upper quadrant pain.  Shortness of breath.  CHEST - 2 VIEW  Comparison: Two-view chest 01/18/2009.  Findings: The heart size is upper limits of normal.  Lungs are hyperinflated.  There is a linear atelectasis are present at the lung bases.  The upper lung fields are clear.  The visualized soft tissues and bony thorax are unremarkable.  IMPRESSION:  1.  Hyperinflation compatible with emphysema. 2.  Areas of linear atelectasis are present at the lung bases bilaterally.   Original Report Authenticated By: Jamesetta Orleans. MATTERN, M.D.      No diagnosis found.   Date: 06/27/2012  Rate: 46  Rhythm: sinus bradycardia  QRS Axis: normal  Intervals: normal  ST/T Wave abnormalities: normal  Conduction Disutrbances:none  Narrative Interpretation: No new ST or T wave changes cw ischemia.   Old EKG Reviewed: unchanged    MDM  4:42 PM 78 y.o. male w hx of COPD pw constant cp for several weeks. Pt notes pain worse w/ eating and walking up hill. Pt seen at urgent care today, reported pain worse w/ breathing then, but denies this here. Pt AFVSS here, appears well on exam, 5/10 pain. Will perform cp workup, will give ASA/nitro.   Will admit to hospitalist for cp r/o.   Clinical Impression 1. Chest pain   2. Asthma   3. Bradycardia   4. Hypertension   5. Hypokalemia            Purvis Sheffield, MD 06/28/12 0022

## 2012-06-27 NOTE — ED Notes (Signed)
Pt reports chest pain fairly constant for the past week , slight pain on left side of neck. Denies n/v/d. Pt reports " when I take a deep breath it doesn't feel right."

## 2012-06-27 NOTE — ED Notes (Signed)
EKG completed at 1600 and given to Dr. Bernette Mayers along with OLD ekg.

## 2012-06-27 NOTE — ED Notes (Signed)
Per EMS-pt reports left constant dull chest pain that has been ongoing for 1 week. Pt reports SOB. Pt was seen at urgent care today. Chest xray was done as well. BP 160/90. 20 g in L hand

## 2012-06-27 NOTE — ED Notes (Signed)
I saw and evaluated the patient, reviewed the resident's note and I agree with the findings and plan.  Agree with EKG interpretation  Pt with CAD risk factors, having chest pain. Admit for rule out.   Menashe Kafer B. Bernette Mayers, MD 06/27/12 2337

## 2012-06-27 NOTE — H&P (Signed)
PCP:   Billee Cashing, MD   Chief Complaint:  Chest pain  HPI: 78 y/o male with h/o asthma who came to the hospital with chest pain, located left side. He also has chronic pain in the left shoulder. He says that pain is also on the left side of neck. Patient does no have h./o CAD. He is hypertensive in the Er but does not take anti hypertensive medications. He admits to chest pain on exertion. He denies nausea, vomiting or diarrhea.  Allergies:  No Known Allergies    Past Medical History  Diagnosis Date  . Asthma     History reviewed. No pertinent past surgical history.  Prior to Admission medications   Medication Sig Start Date End Date Taking? Authorizing Provider  albuterol (PROVENTIL HFA;VENTOLIN HFA) 108 (90 BASE) MCG/ACT inhaler Inhale 2 puffs into the lungs every 6 (six) hours as needed. For wheezing   Yes Historical Provider, MD  ibuprofen (ADVIL,MOTRIN) 200 MG tablet Take 400 mg by mouth daily as needed. For pain   Yes Historical Provider, MD  montelukast (SINGULAIR) 10 MG tablet Take 10 mg by mouth at bedtime.   Yes Historical Provider, MD    Social History:  reports that he has quit smoking. He does not have any smokeless tobacco history on file. He reports that he does not drink alcohol or use illicit drugs.  No family history on file.  Review of Systems:  HEENT: Denies headache, blurred vision, runny nose, sore throat,  Neck: Denies thyroid problems,lymphadenopathy Chest : Positive h/o asthma Heart : Denies  coronary arterey disease GI: Denies  nausea, vomiting, diarrhea, constipation GU: Denies dysuria, urgency, frequency of urination, hematuria Neuro: Denies stroke, seizures, syncope Psych: Denies depression, anxiety, hallucinations   Physical Exam: Blood pressure 165/83, temperature 98.3 F (36.8 C), temperature source Oral, resp. rate 17, SpO2 99.00%. Constitutional:   Patient is a well-developed and well-nourished  male in no acute distress and  cooperative with exam. Head: Normocephalic and atraumatic Mouth: Mucus membranes moist Eyes: PERRL, EOMI, conjunctivae normal Neck: Supple, No Thyromegaly Cardiovascular: RRR, S1 normal, S2 normal Pulmonary/Chest: Scattered wheezing, bibasilar crackles, decreased breath sounds bilaterally Abdominal: Soft. Non-tender, non-distended, bowel sounds are normal, no masses, organomegaly, or guarding present.  Neurological: A&O x3, Strenght is normal and symmetric bilaterally, cranial nerve II-XII are grossly intact, no focal motor deficit, sensory intact to light touch bilaterally.  Extremities : Trace edema bilaterally   Labs on Admission:  Results for orders placed during the hospital encounter of 06/27/12 (from the past 48 hour(s))  CBC WITH DIFFERENTIAL     Status: Abnormal   Collection Time   06/27/12  4:42 PM      Component Value Range Comment   WBC 4.9  4.0 - 10.5 K/uL    RBC 4.31  4.22 - 5.81 MIL/uL    Hemoglobin 12.9 (*) 13.0 - 17.0 g/dL    HCT 16.1 (*) 09.6 - 52.0 %    MCV 86.5  78.0 - 100.0 fL    MCH 29.9  26.0 - 34.0 pg    MCHC 34.6  30.0 - 36.0 g/dL    RDW 04.5  40.9 - 81.1 %    Platelets 192  150 - 400 K/uL    Neutrophils Relative 44  43 - 77 %    Neutro Abs 2.1  1.7 - 7.7 K/uL    Lymphocytes Relative 43  12 - 46 %    Lymphs Abs 2.1  0.7 - 4.0 K/uL  Monocytes Relative 10  3 - 12 %    Monocytes Absolute 0.5  0.1 - 1.0 K/uL    Eosinophils Relative 2  0 - 5 %    Eosinophils Absolute 0.1  0.0 - 0.7 K/uL    Basophils Relative 0  0 - 1 %    Basophils Absolute 0.0  0.0 - 0.1 K/uL   BASIC METABOLIC PANEL     Status: Abnormal   Collection Time   06/27/12  4:42 PM      Component Value Range Comment   Sodium 141  135 - 145 mEq/L    Potassium 3.4 (*) 3.5 - 5.1 mEq/L    Chloride 105  96 - 112 mEq/L    CO2 28  19 - 32 mEq/L    Glucose, Bld 79  70 - 99 mg/dL    BUN 12  6 - 23 mg/dL    Creatinine, Ser 4.54  0.50 - 1.35 mg/dL    Calcium 9.1  8.4 - 09.8 mg/dL    GFR calc non Af  Amer 78 (*) >90 mL/min    GFR calc Af Amer >90  >90 mL/min   TROPONIN I     Status: Normal   Collection Time   06/27/12  4:42 PM      Component Value Range Comment   Troponin I <0.30  <0.30 ng/mL     Radiological Exams on Admission: Dg Chest 2 View  06/27/2012  *RADIOLOGY REPORT*  Clinical Data: Left upper quadrant pain.  Shortness of breath.  CHEST - 2 VIEW  Comparison: Two-view chest 01/18/2009.  Findings: The heart size is upper limits of normal.  Lungs are hyperinflated.  There is a linear atelectasis are present at the lung bases.  The upper lung fields are clear.  The visualized soft tissues and bony thorax are unremarkable.  IMPRESSION:  1.  Hyperinflation compatible with emphysema. 2.  Areas of linear atelectasis are present at the lung bases bilaterally.   Original Report Authenticated By: Jamesetta Orleans. MATTERN, M.D.     Assessment/Plan Active Problems:  Chest pain Asthma Hypertension Bradycardia Hypokalemia  Chest pain Will admit to telemetry, and three sets of cardiac enzymes. Will continue on aspirin. Check lipid panel in am.  Asthma Patient has scattered wheezing, will continue with duoneb nebulizer  Hypertension Patient has untreated hypertension. Will check 2D echo in am. Will start HCTZ and Lisinopril.  ? Diastolic dysfunction Patient has bibasilar crackles, will check BNP. Follow 2D echo.   Bradycardia EKG shows nonspecific t wave changes, monitor on tele.   Hypokalemia Replace potassium  DVT prophylaxis Lovenox  Time Spent on Admission: 50 min  [place] S [facility] Pager: [phone] 06/27/2012, 6:46 PM

## 2012-06-28 ENCOUNTER — Encounter (HOSPITAL_COMMUNITY): Payer: Self-pay | Admitting: *Deleted

## 2012-06-28 DIAGNOSIS — M25512 Pain in left shoulder: Secondary | ICD-10-CM | POA: Diagnosis present

## 2012-06-28 DIAGNOSIS — K219 Gastro-esophageal reflux disease without esophagitis: Secondary | ICD-10-CM | POA: Diagnosis present

## 2012-06-28 LAB — LIPID PANEL
Cholesterol: 166 mg/dL (ref 0–200)
Total CHOL/HDL Ratio: 4.4 RATIO

## 2012-06-28 LAB — COMPREHENSIVE METABOLIC PANEL
AST: 21 U/L (ref 0–37)
Albumin: 3.3 g/dL — ABNORMAL LOW (ref 3.5–5.2)
Alkaline Phosphatase: 36 U/L — ABNORMAL LOW (ref 39–117)
Chloride: 102 mEq/L (ref 96–112)
Potassium: 3.6 mEq/L (ref 3.5–5.1)
Sodium: 139 mEq/L (ref 135–145)
Total Bilirubin: 0.3 mg/dL (ref 0.3–1.2)
Total Protein: 6.5 g/dL (ref 6.0–8.3)

## 2012-06-28 LAB — CBC
HCT: 37.3 % — ABNORMAL LOW (ref 39.0–52.0)
MCH: 30.3 pg (ref 26.0–34.0)
MCV: 87.6 fL (ref 78.0–100.0)
Platelets: 193 10*3/uL (ref 150–400)
RDW: 13.6 % (ref 11.5–15.5)
WBC: 3.8 10*3/uL — ABNORMAL LOW (ref 4.0–10.5)

## 2012-06-28 LAB — TROPONIN I: Troponin I: <0.3 ng/mL (ref <0.30)

## 2012-06-28 MED ORDER — ALBUTEROL SULFATE (5 MG/ML) 0.5% IN NEBU
2.5000 mg | INHALATION_SOLUTION | Freq: Two times a day (BID) | RESPIRATORY_TRACT | Status: DC
  Administered 2012-06-28 (×2): 2.5 mg via RESPIRATORY_TRACT
  Filled 2012-06-28 (×2): qty 0.5

## 2012-06-28 MED ORDER — IPRATROPIUM BROMIDE 0.02 % IN SOLN
0.5000 mg | Freq: Two times a day (BID) | RESPIRATORY_TRACT | Status: DC
  Administered 2012-06-28 (×2): 0.5 mg via RESPIRATORY_TRACT
  Filled 2012-06-28 (×2): qty 2.5

## 2012-06-28 MED ORDER — IPRATROPIUM BROMIDE 0.02 % IN SOLN
0.5000 mg | Freq: Four times a day (QID) | RESPIRATORY_TRACT | Status: DC | PRN
  Administered 2012-06-29: 0.5 mg via RESPIRATORY_TRACT
  Filled 2012-06-28 (×2): qty 2.5

## 2012-06-28 MED ORDER — ALBUTEROL SULFATE (5 MG/ML) 0.5% IN NEBU
2.5000 mg | INHALATION_SOLUTION | Freq: Four times a day (QID) | RESPIRATORY_TRACT | Status: DC | PRN
  Administered 2012-06-29: 2.5 mg via RESPIRATORY_TRACT
  Filled 2012-06-28 (×2): qty 0.5

## 2012-06-28 MED ORDER — LISINOPRIL 20 MG PO TABS: 20.0000 mg | ORAL_TABLET | Freq: Every day | ORAL | Status: DC

## 2012-06-28 MED ORDER — TRAMADOL HCL 50 MG PO TABS: 25.0000 mg | ORAL_TABLET | Freq: Four times a day (QID) | ORAL | Status: DC | PRN

## 2012-06-28 MED ORDER — LISINOPRIL 20 MG PO TABS
20.0000 mg | ORAL_TABLET | Freq: Every day | ORAL | Status: DC
  Administered 2012-06-28 – 2012-06-29 (×2): 20 mg via ORAL
  Filled 2012-06-28 (×2): qty 1

## 2012-06-28 NOTE — Progress Notes (Signed)
Pt c/o sob this am. Pt vitals checked bp 149/81 , 97%RA. Pt sob resolved. Will continue to monitor.

## 2012-06-28 NOTE — Progress Notes (Signed)
[facility] PROGRESS NOTE  Stephen Hawkins:[id number] DOB: 1931-09-11 DOA: 06/27/2012 PCP: Billee Cashing, MD  Assessment/Plan: Active Problems:  Chest pain  Asthma  Hypertension  Bradycardia  Hypokalemia    1. Chest pain:   Patient presented with chest pain, described as "like a feeling of indigestion', occuring mainly after meals, and relieved by belching, now going on for 1-2 weeks. On occasion, he has had exertional chest discomfort. According to him, he gets these episodes, about 5 times per day. 12-lead EKG revealed sinus bradycardia, but no acute ischemic changes. Cardiac enzymes remained unelevated, and patient has had no arrhythmias on telemetric monitoring. On Aspirin. Lipid panel shows TC 166, TG 114, HDL 38 and LDL 105.  Chest pain sounds atypical, and more consistent with GI etiology, particularly against a background of prn NSAID use. Have placed patient on PPI, but in view of age, gender, and HTN, stratification is warranted. Have consulted Dr Sharyn Lull, cardiologist.   2. Asthma:  Patient has a known history of bronchial asthma. No clinical evidence of acute exacerbation at this time. Managing with bronchodilators. Stable. 3. Hypertension:   Hypertension documented on initial presentation. Patient has no known previous history of HTN, and was not on antihypertensives, pre-admission. ProBNP is normal at 60.9. Started on HCTZ and Lisinopril at presentation. 2D echocardiogram is pending. BP is still sub-optimally controlled today, so have increased Lisinopril to 20 mg daily. Will discontinue HCTZ.   4. Bradycardia:  Patient has a bradycardia, with HR 53-67. EKG showed SR. Will check TSH. 5. Hypokalemia:  Repleting as indicated.  6. Left shoulder DJD:  Patient has had pain in the left shoulder, for the last 6 months, without antecedent trauma, for which he takes Advil prn. ROM is limited by pain, particularly on shoulder elevation and internal rotation. Clinical  findings are consistent with DJD. Analgesics have been recommended, but may need to avoid NSAIDS, due to possible GERD symptoms.    Code Status: Full Code. Family Communication:  Disposition Plan: To be determined.   Brief narrative: 78 y/o male with h/o asthma, benign prostatic hypertrophy/bladder outlet obstruction, s/p TURP 08/2006, left inguinal hernia s/p repair 08/2006, int/ext hemorrhoid, previous sigmoid polyp, and chronic left shoulder DJD/pain, presenting with exertional left-sided chest pain, radiating to the left side of his neck. He was found to be hypertensive in the ED. Admitted for further evaluation and management.   Consultants:  Dr Rinaldo Cloud, Cardiologist.   Procedures:  CXR.  Antibiotics:  N/A.   HPI/Subjective: Has occasional belching, otherwise, asymptomatic.   Objective: Vital signs in last 24 hours: Temp:  [97.7 F (36.5 C)-98.7 F (37.1 C)] 98 F (36.7 C) (09/18 0500) Pulse Rate:  [51-70] 53  (09/18 0500) Resp:  [13-20] 18  (09/18 0500) BP: (120-186)/(63-87) 149/81 mmHg (09/18 0635) SpO2:  [96 %-99 %] 96 % (09/18 0838) Weight change:  Last BM Date: 06/27/12  Intake/Output from previous day:       Physical Exam: General: Comfortable, alert, communicative, fully oriented, not short of breath at rest.  HEENT:  No clinical pallor, no jaundice, no conjunctival injection or discharge. Hydration is satisfactory.  NECK:  Supple, JVP not seen, no carotid bruits, no palpable lymphadenopathy, no palpable goiter. CHEST:  Clinically clear to auscultation, no wheezes, no crackles. HEART:  Sounds 1 and 2 heard, normal, regular, no murmurs. ABDOMEN:  Full, soft, non-tender, no palpable organomegaly, no palpable masses, normal bowel sounds. GENITALIA:  Not examined. LOWER EXTREMITIES:  No pitting edema, palpable peripheral pulses.  MUSCULOSKELETAL SYSTEM:  Generalized osteoarthritic changes, has decreased ROM left shoulder, secondary to pain,  otherwise, normal. CENTRAL NERVOUS SYSTEM:  No focal neurologic deficit on gross examination.  Lab Results:  Basename 06/28/12 0725 06/27/12 1929  WBC 3.8* 5.1  HGB 12.9* 12.4*  HCT 37.3* 36.2*  PLT 193 195    Basename 06/27/12 1929 06/27/12 1642  NA -- 141  K -- 3.4*  CL -- 105  CO2 -- 28  GLUCOSE -- 79  BUN -- 12  CREATININE 0.95 0.92  CALCIUM -- 9.1   No results found for this or any previous visit (from the past 240 hour(s)).   Studies/Results: Dg Chest 2 View  06/27/2012  *RADIOLOGY REPORT*  Clinical Data: Left upper quadrant pain.  Shortness of breath.  CHEST - 2 VIEW  Comparison: Two-view chest 01/18/2009.  Findings: The heart size is upper limits of normal.  Lungs are hyperinflated.  There is a linear atelectasis are present at the lung bases.  The upper lung fields are clear.  The visualized soft tissues and bony thorax are unremarkable.  IMPRESSION:  1.  Hyperinflation compatible with emphysema. 2.  Areas of linear atelectasis are present at the lung bases bilaterally.   Original Report Authenticated By: Jamesetta Orleans. MATTERN, M.D.     Medications: Scheduled Meds:   . albuterol  2.5 mg Nebulization BID  . alum & mag hydroxide-simeth  30 mL Oral Q6H  . aspirin EC  325 mg Oral Daily  . aspirin  325 mg Oral Once  . enoxaparin (LOVENOX) injection  40 mg Subcutaneous Q24H  . hydrochlorothiazide  12.5 mg Oral Daily  . ipratropium  0.5 mg Nebulization BID  . lisinopril  10 mg Oral Daily  . montelukast  10 mg Oral QHS  . pantoprazole  40 mg Oral Q1200  . potassium chloride  40 mEq Oral Once  . sodium chloride  3 mL Intravenous Q12H  . DISCONTD: albuterol  2.5 mg Nebulization Q6H  . DISCONTD: ipratropium  0.5 mg Nebulization Q6H   Continuous Infusions:  PRN Meds:.sodium chloride, albuterol, HYDROcodone-acetaminophen, ipratropium, nitroGLYCERIN, sodium chloride, DISCONTD: albuterol    LOS: 1 day   Kebrina Friend,CHRISTOPHER  [facility] Pager [phone]. If 8PM-8AM,  please contact night-coverage at [email or web address], password [facility] 06/28/2012, 8:48 AM  LOS: 1 day

## 2012-06-28 NOTE — Progress Notes (Signed)
*  PRELIMINARY RESULTS* Echocardiogram 2D Echocardiogram has been performed.  Stephen Hawkins 06/28/2012, 2:57 PM

## 2012-06-28 NOTE — Progress Notes (Signed)
Utilization review completed.  

## 2012-06-28 NOTE — Consult Note (Signed)
Reason for Consult: Chest pain Referring Physician: [facility]  Stephen Hawkins is an 78 y.o. male.  HPI: Patient is 78 year old male with past medical history significant for bronchial asthma/COPD history of benign hypertrophia of prostate, hypertension on no medications, was admitted because of left-sided chest pain radiating to neck and left shoulder states chest pain occurs after eating food half to one hour relieved with burping. Also complains of shoulder and neck pain which increases with movement. Patient denies any history of exertional chest pain but gives history of exertional dyspnea associated with feeling weak and tired patient denies any nausea vomiting diaphoresis. Denies PND orthopnea leg swelling. Denies any recent cardiac workup. Denies any cough fever chills.  Past Medical History  Diagnosis Date  . Asthma     Past Surgical History  Procedure Date  . Hernia repair   . Knee arthroscopy     knee surgery in the army    History reviewed. No pertinent family history.  Social History:  reports that he has quit smoking. He does not have any smokeless tobacco history on file. He reports that he does not drink alcohol or use illicit drugs.  Allergies: No Known Allergies  Medications: I have reviewed the patient's current medications.  Results for orders placed during the hospital encounter of 06/27/12 (from the past 48 hour(s))  CBC WITH DIFFERENTIAL     Status: Abnormal   Collection Time   06/27/12  4:42 PM      Component Value Range Comment   WBC 4.9  4.0 - 10.5 K/uL    RBC 4.31  4.22 - 5.81 MIL/uL    Hemoglobin 12.9 (*) 13.0 - 17.0 g/dL    HCT 04.5 (*) 40.9 - 52.0 %    MCV 86.5  78.0 - 100.0 fL    MCH 29.9  26.0 - 34.0 pg    MCHC 34.6  30.0 - 36.0 g/dL    RDW 81.1  91.4 - 78.2 %    Platelets 192  150 - 400 K/uL    Neutrophils Relative 44  43 - 77 %    Neutro Abs 2.1  1.7 - 7.7 K/uL    Lymphocytes Relative 43  12 - 46 %    Lymphs Abs 2.1  0.7 - 4.0 K/uL     Monocytes Relative 10  3 - 12 %    Monocytes Absolute 0.5  0.1 - 1.0 K/uL    Eosinophils Relative 2  0 - 5 %    Eosinophils Absolute 0.1  0.0 - 0.7 K/uL    Basophils Relative 0  0 - 1 %    Basophils Absolute 0.0  0.0 - 0.1 K/uL   BASIC METABOLIC PANEL     Status: Abnormal   Collection Time   06/27/12  4:42 PM      Component Value Range Comment   Sodium 141  135 - 145 mEq/L    Potassium 3.4 (*) 3.5 - 5.1 mEq/L    Chloride 105  96 - 112 mEq/L    CO2 28  19 - 32 mEq/L    Glucose, Bld 79  70 - 99 mg/dL    BUN 12  6 - 23 mg/dL    Creatinine, Ser 9.56  0.50 - 1.35 mg/dL    Calcium 9.1  8.4 - 21.3 mg/dL    GFR calc non Af Amer 78 (*) >90 mL/min    GFR calc Af Amer >90  >90 mL/min   TROPONIN I  Status: Normal   Collection Time   06/27/12  4:42 PM      Component Value Range Comment   Troponin I <0.30  <0.30 ng/mL   TROPONIN I     Status: Normal   Collection Time   06/27/12  7:28 PM      Component Value Range Comment   Troponin I <0.30  <0.30 ng/mL   CK TOTAL AND CKMB     Status: Abnormal   Collection Time   06/27/12  7:28 PM      Component Value Range Comment   Total CK 523 (*) 7 - 232 U/L    CK, MB 8.6 (*) 0.3 - 4.0 ng/mL    Relative Index 1.6  0.0 - 2.5   PRO B NATRIURETIC PEPTIDE     Status: Normal   Collection Time   06/27/12  7:28 PM      Component Value Range Comment   Pro B Natriuretic peptide (BNP) 60.9  0 - 450 pg/mL   CBC     Status: Abnormal   Collection Time   06/27/12  7:29 PM      Component Value Range Comment   WBC 5.1  4.0 - 10.5 K/uL    RBC 4.18 (*) 4.22 - 5.81 MIL/uL    Hemoglobin 12.4 (*) 13.0 - 17.0 g/dL    HCT 16.1 (*) 09.6 - 52.0 %    MCV 86.6  78.0 - 100.0 fL    MCH 29.7  26.0 - 34.0 pg    MCHC 34.3  30.0 - 36.0 g/dL    RDW 04.5  40.9 - 81.1 %    Platelets 195  150 - 400 K/uL   CREATININE, SERUM     Status: Abnormal   Collection Time   06/27/12  7:29 PM      Component Value Range Comment   Creatinine, Ser 0.95  0.50 - 1.35 mg/dL    GFR calc non  Af Amer 77 (*) >90 mL/min    GFR calc Af Amer 89 (*) >90 mL/min   TROPONIN I     Status: Normal   Collection Time   06/28/12  1:17 AM      Component Value Range Comment   Troponin I <0.30  <0.30 ng/mL   COMPREHENSIVE METABOLIC PANEL     Status: Abnormal   Collection Time   06/28/12  7:25 AM      Component Value Range Comment   Sodium 139  135 - 145 mEq/L    Potassium 3.6  3.5 - 5.1 mEq/L    Chloride 102  96 - 112 mEq/L    CO2 30  19 - 32 mEq/L    Glucose, Bld 94  70 - 99 mg/dL    BUN 12  6 - 23 mg/dL    Creatinine, Ser 9.14  0.50 - 1.35 mg/dL    Calcium 8.9  8.4 - 78.2 mg/dL    Total Protein 6.5  6.0 - 8.3 g/dL    Albumin 3.3 (*) 3.5 - 5.2 g/dL    AST 21  0 - 37 U/L    ALT 18  0 - 53 U/L    Alkaline Phosphatase 36 (*) 39 - 117 U/L    Total Bilirubin 0.3  0.3 - 1.2 mg/dL    GFR calc non Af Amer 76 (*) >90 mL/min    GFR calc Af Amer 89 (*) >90 mL/min   CBC     Status: Abnormal   Collection Time  06/28/12  7:25 AM      Component Value Range Comment   WBC 3.8 (*) 4.0 - 10.5 K/uL    RBC 4.26  4.22 - 5.81 MIL/uL    Hemoglobin 12.9 (*) 13.0 - 17.0 g/dL    HCT 47.8 (*) 29.5 - 52.0 %    MCV 87.6  78.0 - 100.0 fL    MCH 30.3  26.0 - 34.0 pg    MCHC 34.6  30.0 - 36.0 g/dL    RDW 62.1  30.8 - 65.7 %    Platelets 193  150 - 400 K/uL   TROPONIN I     Status: Normal   Collection Time   06/28/12  7:25 AM      Component Value Range Comment   Troponin I <0.30  <0.30 ng/mL   LIPID PANEL     Status: Abnormal   Collection Time   06/28/12  7:25 AM      Component Value Range Comment   Cholesterol 166  0 - 200 mg/dL    Triglycerides 846  <962 mg/dL    HDL 38 (*) >95 mg/dL    Total CHOL/HDL Ratio 4.4      VLDL 23  0 - 40 mg/dL    LDL Cholesterol 284 (*) 0 - 99 mg/dL     Dg Chest 2 View  1/32/4401  *RADIOLOGY REPORT*  Clinical Data: Left upper quadrant pain.  Shortness of breath.  CHEST - 2 VIEW  Comparison: Two-view chest 01/18/2009.  Findings: The heart size is upper limits of normal.   Lungs are hyperinflated.  There is a linear atelectasis are present at the lung bases.  The upper lung fields are clear.  The visualized soft tissues and bony thorax are unremarkable.  IMPRESSION:  1.  Hyperinflation compatible with emphysema. 2.  Areas of linear atelectasis are present at the lung bases bilaterally.   Original Report Authenticated By: Jamesetta Orleans. MATTERN, M.D.     Review of Systems  Constitutional: Negative for fever and chills.  Eyes: Negative for blurred vision.  Respiratory: Negative for cough and sputum production.   Cardiovascular: Positive for chest pain. Negative for palpitations, orthopnea, claudication and leg swelling.  Gastrointestinal: Positive for heartburn. Negative for nausea, vomiting and abdominal pain.  Genitourinary: Negative for dysuria.  Neurological: Negative for dizziness and headaches.   Blood pressure 149/81, pulse 53, temperature 98 F (36.7 C), temperature source Oral, resp. rate 18, height 6' (1.829 m), weight 110.678 kg (244 lb), SpO2 96.00%. Physical Exam  Constitutional: He is oriented to person, place, and time. He appears well-developed and well-nourished.  HENT:  Head: Normocephalic and atraumatic.  Nose: Nose normal.  Mouth/Throat: No oropharyngeal exudate.  Eyes: Conjunctivae normal are normal. Pupils are equal, round, and reactive to light. Left eye exhibits no discharge. No scleral icterus.  Neck: Neck supple. No JVD present. No tracheal deviation present. No thyromegaly present.  Cardiovascular:       Bradycardic S1-S2 normal soft systolic and diastolic murmur left lower sternal border  Respiratory: Effort normal and breath sounds normal. No respiratory distress. He has no wheezes. He has no rales.  GI: Soft. Bowel sounds are normal. He exhibits no distension. There is no tenderness. There is no rebound.  Musculoskeletal: He exhibits no edema and no tenderness.  Neurological: He is alert and oriented to person, place, and time.     Assessment/Plan: Atypical chest pain rule out MI Exertional dyspnea rule out coronary insufficiency Hypertension History of bronchial asthma/COPD Remote  tobacco abuse History of benign hypertrophia of prostate status post TURP Degenerative joint disease Plan Continue present management We'll schedule for nuclear stress test in a.m.  Robynn Pane 06/28/2012, 5:26 PM

## 2012-06-29 ENCOUNTER — Inpatient Hospital Stay (HOSPITAL_COMMUNITY): Payer: BC Managed Care – PPO

## 2012-06-29 DIAGNOSIS — K219 Gastro-esophageal reflux disease without esophagitis: Secondary | ICD-10-CM

## 2012-06-29 LAB — BASIC METABOLIC PANEL
BUN: 15 mg/dL (ref 6–23)
CO2: 30 mEq/L (ref 19–32)
GFR calc non Af Amer: 76 mL/min — ABNORMAL LOW (ref >90)
Glucose, Bld: 100 mg/dL — ABNORMAL HIGH (ref 70–99)
Potassium: 3.9 mEq/L (ref 3.5–5.1)
Sodium: 142 mEq/L (ref 135–145)

## 2012-06-29 MED ORDER — TECHNETIUM TC 99M SESTAMIBI - CARDIOLITE
30.0000 | Freq: Once | INTRAVENOUS | Status: AC | PRN
  Administered 2012-06-29: 30 via INTRAVENOUS

## 2012-06-29 MED ORDER — ASPIRIN 325 MG PO TBEC: 325.0000 mg | DELAYED_RELEASE_TABLET | Freq: Every day | ORAL | Status: DC

## 2012-06-29 MED ORDER — PANTOPRAZOLE SODIUM 40 MG PO TBEC: 40.0000 mg | DELAYED_RELEASE_TABLET | Freq: Every day | ORAL | Status: DC

## 2012-06-29 MED ORDER — TRAMADOL HCL 50 MG PO TABS: 25.0000 mg | ORAL_TABLET | Freq: Four times a day (QID) | ORAL | Status: DC | PRN

## 2012-06-29 MED ORDER — AMLODIPINE BESYLATE 10 MG PO TABS: 10.0000 mg | ORAL_TABLET | Freq: Every day | ORAL | Status: DC

## 2012-06-29 MED ORDER — REGADENOSON 0.4 MG/5ML IV SOLN
0.4000 mg | Freq: Once | INTRAVENOUS | Status: AC
  Administered 2012-06-29: 0.4 mg via INTRAVENOUS

## 2012-06-29 MED ORDER — LISINOPRIL 20 MG PO TABS: 20.0000 mg | ORAL_TABLET | Freq: Every day | ORAL | Status: DC

## 2012-06-29 MED ORDER — TECHNETIUM TC 99M SESTAMIBI - CARDIOLITE
10.0000 | Freq: Once | INTRAVENOUS | Status: AC | PRN
  Administered 2012-06-29: 10 via INTRAVENOUS

## 2012-06-29 NOTE — Progress Notes (Signed)
Subjective:  Patient denies any chest pain or shortness of breath. Denies also neck or shoulder pain  Objective:  Vital Signs in the last 24 hours: Temp:  [97.2 F (36.2 C)-98.1 F (36.7 C)] 98.1 F (36.7 C) (09/19 0500) Pulse Rate:  [61-73] 73  (09/19 0500) Resp:  [18] 18  (09/18 2100) BP: (148-165)/(45-88) 158/82 mmHg (09/19 0848) SpO2:  [93 %-96 %] 95 % (09/19 0500) Weight:  [110.678 kg (244 lb)] 110.678 kg (244 lb) (09/18 1516)  Intake/Output from previous day: 09/18 0701 - 09/19 0700 In: 6 [I.V.:6] Out: -  Intake/Output from this shift:    Physical Exam: Neck: no adenopathy, no carotid bruit, no JVD and supple, symmetrical, trachea midline Lungs: clear to auscultation bilaterally Heart: regularly irregular rhythm and Bradycardic S1-S2 normal soft systolic and diastolic murmur noted left lower sternal border Abdomen: soft, non-tender; bowel sounds normal; no masses,  no organomegaly Extremities: extremities normal, atraumatic, no cyanosis or edema  Lab Results:  Basename 06/28/12 0725 06/27/12 1929  WBC 3.8* 5.1  HGB 12.9* 12.4*  PLT 193 195    Basename 06/29/12 0610 06/28/12 0725  NA 142 139  K 3.9 3.6  CL 104 102  CO2 30 30  GLUCOSE 100* 94  BUN 15 12  CREATININE 0.98 0.97    Basename 06/28/12 0725 06/28/12 0117  TROPONINI <0.30 <0.30   Hepatic Function Panel  Basename 06/28/12 0725  PROT 6.5  ALBUMIN 3.3*  AST 21  ALT 18  ALKPHOS 36*  BILITOT 0.3  BILIDIR --  IBILI --    Basename 06/28/12 0725  CHOL 166   No results found for this basename: PROTIME in the last 72 hours  Imaging: Imaging results have been reviewed and Dg Chest 2 View  06/27/2012  *RADIOLOGY REPORT*  Clinical Data: Left upper quadrant pain.  Shortness of breath.  CHEST - 2 VIEW  Comparison: Two-view chest 01/18/2009.  Findings: The heart size is upper limits of normal.  Lungs are hyperinflated.  There is a linear atelectasis are present at the lung bases.  The upper lung  fields are clear.  The visualized soft tissues and bony thorax are unremarkable.  IMPRESSION:  1.  Hyperinflation compatible with emphysema. 2.  Areas of linear atelectasis are present at the lung bases bilaterally.   Original Report Authenticated By: Jamesetta Orleans. MATTERN, M.D.     Cardiac Studies:  Assessment/Plan:  Status post Atypical chest pain MI ruled out Exertional dyspnea rule out coronary insufficiency  Hypertension  History of bronchial asthma/COPD  Remote tobacco abuse  History of benign hypertrophia of prostate status post TURP  Degenerative joint disease Plan Schedule for nuclear stress test today  LOS: 2 days    Eureka Valdes N 06/29/2012, 9:44 AM

## 2012-06-29 NOTE — Discharge Summary (Signed)
Physician Discharge Summary  Stephen Hawkins ZOX:[id number] DOB: 12/21/1930 DOA: 06/27/2012  PCP: Billee Cashing, MD  Admit date: 06/27/2012 Discharge date: 06/29/2012  Recommendations for Outpatient Follow-up:  1. Follow up with primary MD.   Discharge Diagnoses:  Active Problems:  Chest pain  Asthma  Hypertension  Bradycardia  Hypokalemia  GERD (gastroesophageal reflux disease)  Left shoulder pain   Discharge Condition: Satisfactory.   Diet recommendation: Heart-Healthy.   Filed Weights   06/28/12 1516  Weight: 110.678 kg (244 lb)    History of present illness:  78 y/o male with h/o asthma, benign prostatic hypertrophy/bladder outlet obstruction, s/p TURP 08/2006, left inguinal hernia s/p repair 08/2006, int/ext hemorrhoid, previous sigmoid polyp, and chronic left shoulder DJD/pain, presenting with exertional left-sided chest pain, radiating to the left side of his neck. He was found to be hypertensive in the ED. Admitted for further evaluation and management.   Hospital Course:  1. Chest pain:  Patient presented with chest pain, described as "like a feeling of indigestion", occuring mainly after meals, and relieved by belching, now going on for 1-2 weeks. On occasion, he has had exertional chest discomfort. According to him, he gets these episodes, about 5 times per day. 12-lead EKG revealed sinus bradycardia, but no acute ischemic changes. Cardiac enzymes remained unelevated, and patient had no arrhythmias on telemetric monitoring. Lipid panel shows TC 166, TG 114, HDL 38 and LDL 105. Patient was placed on low dose Aspirin. 2D echocardiogram showed normal LV cavity size, EF of 50% to 55% and no regional wall motion abnormalities. There was mild aortic valve regurgitation. Chest pain sounds atypical, and more consistent with GI etiology, particularly against a background of prn NSAID use. Patient was placed on PPI, and had no recurrence of symptoms during his hospitalization.  In view of cardiovascular risk factors of age, gender, and HTN, Dr Sharyn Lull, cardiologist, provided consultation, and Stress Myoview was performed on 06/29/12. This was a normal examination without evidence of pharmacologically induced myocardial ischemia. The calculated left ventricular ejection fraction is 61%. Patient has been reassured accordingly.  2. Asthma:  Patient has a known history of bronchial asthma. No clinical evidence of acute exacerbation was noted during his hospitalization, and he remained stable on bronchodilators.  3. Hypertension:  Hypertension was documented on initial presentation. Patient has no known previous history of HTN, and was not on antihypertensives, pre-admission. ProBNP was normal at 60.9. See #1 above, for 2D echocardiographic findings. He has been placed on Lisinopril and Norvasc. Further titration may be needed. This will be deferred to PMD on follow up.  4. Bradycardia: Patient had a bradycardia, with HR 53-67. EKG showed SR. TSH is pending at this time.  5. Hypokalemia:  Repleting as indicated.  6. Left shoulder DJD: Patient has had pain in the left shoulder, for the last 6 months, without antecedent trauma, for which he takes Advil prn. ROM is limited by pain, particularly on shoulder elevation and internal rotation. Clinical findings are consistent with DJD. Analgesics have been recommended, but may need to avoid NSAIDS, due to possible GERD symptoms.    Procedures:  See below.   Consultations:  Dr Rinaldo Cloud, cardiologist.   Discharge Exam: Filed Vitals:   06/29/12 0841 06/29/12 0844 06/29/12 0846 06/29/12 0848  BP: 161/88 162/79 165/81 158/82  Pulse:      Temp:      TempSrc:      Resp:      Height:      Weight:  SpO2:        General: Comfortable, alert, communicative, fully oriented, not short of breath at rest.  HEENT: No clinical pallor, no jaundice, no conjunctival injection or discharge. Hydration is satisfactory.  NECK: Supple,  JVP not seen, no carotid bruits, no palpable lymphadenopathy, no palpable goiter.  CHEST: Clinically clear to auscultation, no wheezes, no crackles.  HEART: Sounds 1 and 2 heard, normal, regular, no murmurs.  ABDOMEN: Full, soft, non-tender, no palpable organomegaly, no palpable masses, normal bowel sounds.  GENITALIA: Not examined.  LOWER EXTREMITIES: No pitting edema, palpable peripheral pulses.  MUSCULOSKELETAL SYSTEM: Generalized osteoarthritic changes, has decreased ROM left shoulder, secondary to pain, otherwise, normal.  CENTRAL NERVOUS SYSTEM: No focal neurologic deficit on gross examination.  Discharge Instructions      Discharge Orders    Future Orders Please Complete By Expires   Diet - low sodium heart healthy      Increase activity slowly          Medication List     As of 06/29/2012 11:50 AM    STOP taking these medications         ibuprofen 200 MG tablet   Commonly known as: ADVIL,MOTRIN      TAKE these medications         albuterol 108 (90 BASE) MCG/ACT inhaler   Commonly known as: PROVENTIL HFA;VENTOLIN HFA   Inhale 2 puffs into the lungs every 6 (six) hours as needed. For wheezing      amLODipine 10 MG tablet   Commonly known as: NORVASC   Take 1 tablet (10 mg total) by mouth daily.      aspirin 325 MG EC tablet   Take 1 tablet (325 mg total) by mouth daily.      lisinopril 20 MG tablet   Commonly known as: PRINIVIL,ZESTRIL   Take 1 tablet (20 mg total) by mouth daily.      montelukast 10 MG tablet   Commonly known as: SINGULAIR   Take 10 mg by mouth at bedtime.      pantoprazole 40 MG tablet   Commonly known as: PROTONIX   Take 1 tablet (40 mg total) by mouth daily at 12 noon.      traMADol 50 MG tablet   Commonly known as: ULTRAM   Take 0.5 tablets (25 mg total) by mouth every 6 (six) hours as needed (Left shoulder pain. ).        Follow-up Information    Follow up with Billee Cashing, MD. In 1 week.   Contact information:   [address] [place] [address] [phone]           The results of significant diagnostics from this hospitalization (including imaging, microbiology, ancillary and laboratory) are listed below for reference.    Significant Diagnostic Studies: Dg Chest 2 View  06/27/2012  *RADIOLOGY REPORT*  Clinical Data: Left upper quadrant pain.  Shortness of breath.  CHEST - 2 VIEW  Comparison: Two-view chest 01/18/2009.  Findings: The heart size is upper limits of normal.  Lungs are hyperinflated.  There is a linear atelectasis are present at the lung bases.  The upper lung fields are clear.  The visualized soft tissues and bony thorax are unremarkable.  IMPRESSION:  1.  Hyperinflation compatible with emphysema. 2.  Areas of linear atelectasis are present at the lung bases bilaterally.   Original Report Authenticated By: Jamesetta Orleans. MATTERN, M.D.    Nm Myocar Multi W/spect W/wall Motion /  Ef  06/29/2012  *RADIOLOGY REPORT*  Clinical Data:  Chest pain.  History of asthma.  MYOCARDIAL IMAGING WITH SPECT (REST AND PHARMACOLOGIC-STRESS) GATED LEFT VENTRICULAR WALL MOTION STUDY LEFT VENTRICULAR EJECTION FRACTION  Technique:  Resting myocardial SPECT imaging was initially performed after intravenous administration of radiopharmaceutical. Myocardial SPECT was subsequently performed after additional radiopharmaceutical injection during pharmacologic-stress (Lexiscan)supervised by the Cardiology staff.  Quantitative gated imaging was also performed to evaluate left ventricular wall motion, and estimate left ventricular ejection fraction.  Radiopharmaceutical:  10 mCi Tc-21m Myoview at rest and 30 mCi during stress.  Comparison: Radiographs 06/27/2012.  Findings:  SPECT images demonstrate normal left ventricular activity .  There are no suspicious fixed or reversible perfusion defects.  Gated cine images were reviewed on the workstation and demonstrate normal left ventricular wall motion and systolic  thickening. The QGS ejection fraction measured at rest is 61% with an end diastolic volume of 106 ml and an end-systolic volume of 41 ml.  IMPRESSION:  Normal examination without evidence of pharmacologically induced myocardial ischemia.  The calculated left ventricular ejection fraction is 61%.   Original Report Authenticated By: Gerrianne Scale, M.D.     Microbiology: No results found for this or any previous visit (from the past 240 hour(s)).   Labs: Basic Metabolic Panel:  Lab 06/29/12 9604 06/28/12 0725 06/27/12 1929 06/27/12 1642  NA 142 139 -- 141  K 3.9 3.6 -- 3.4*  CL 104 102 -- 105  CO2 30 30 -- 28  GLUCOSE 100* 94 -- 79  BUN 15 12 -- 12  CREATININE 0.98 0.97 0.95 0.92  CALCIUM 9.1 8.9 -- 9.1  MG -- -- -- --  PHOS -- -- -- --   Liver Function Tests:  Lab 06/28/12 0725  AST 21  ALT 18  ALKPHOS 36*  BILITOT 0.3  PROT 6.5  ALBUMIN 3.3*   No results found for this basename: LIPASE:5,AMYLASE:5 in the last 168 hours No results found for this basename: AMMONIA:5 in the last 168 hours CBC:  Lab 06/28/12 0725 06/27/12 1929 06/27/12 1642  WBC 3.8* 5.1 4.9  NEUTROABS -- -- 2.1  HGB 12.9* 12.4* 12.9*  HCT 37.3* 36.2* 37.3*  MCV 87.6 86.6 86.5  PLT 193 195 192   Cardiac Enzymes:  Lab 06/28/12 0725 06/28/12 0117 06/27/12 1928 06/27/12 1642  CKTOTAL -- -- 523* --  CKMB -- -- 8.6* --  CKMBINDEX -- -- -- --  TROPONINI <0.30 <0.30 <0.30 <0.30   BNP: BNP (last 3 results)  Basename 06/27/12 1928  PROBNP 60.9   CBG: No results found for this basename: GLUCAP:5 in the last 168 hours  Time coordinating discharge: 35 minutes  Signed:  Priyah Schmuck,CHRISTOPHER  [facility] 06/29/2012, 11:50 AM

## 2014-01-11 ENCOUNTER — Emergency Department (HOSPITAL_COMMUNITY)
Admission: EM | Admit: 2014-01-11 | Discharge: 2014-01-11 | Disposition: A | Discharge status: Home / Self Care | Payer: BC Managed Care – PPO | Attending: Emergency Medicine | Admitting: Emergency Medicine

## 2014-01-11 ENCOUNTER — Encounter (HOSPITAL_COMMUNITY): Payer: Self-pay | Admitting: Emergency Medicine

## 2014-01-11 ENCOUNTER — Emergency Department (HOSPITAL_COMMUNITY): Payer: BC Managed Care – PPO

## 2014-01-11 DIAGNOSIS — J45901 Unspecified asthma with (acute) exacerbation: Secondary | ICD-10-CM | POA: Diagnosis not present

## 2014-01-11 DIAGNOSIS — R059 Cough, unspecified: Secondary | ICD-10-CM | POA: Diagnosis present

## 2014-01-11 DIAGNOSIS — Z87891 Personal history of nicotine dependence: Secondary | ICD-10-CM | POA: Insufficient documentation

## 2014-01-11 DIAGNOSIS — Z79899 Other long term (current) drug therapy: Secondary | ICD-10-CM | POA: Insufficient documentation

## 2014-01-11 DIAGNOSIS — R109 Unspecified abdominal pain: Secondary | ICD-10-CM | POA: Diagnosis not present

## 2014-01-11 DIAGNOSIS — R05 Cough: Secondary | ICD-10-CM | POA: Diagnosis present

## 2014-01-11 LAB — BASIC METABOLIC PANEL
BUN: 11 mg/dL (ref 6–23)
CO2: 22 mEq/L (ref 19–32)
CREATININE: 0.93 mg/dL (ref 0.50–1.35)
Calcium: 8.7 mg/dL (ref 8.4–10.5)
Chloride: 100 mEq/L (ref 96–112)
GFR, EST AFRICAN AMERICAN: 90 mL/min — AB (ref >90)
GFR, EST NON AFRICAN AMERICAN: 77 mL/min — AB (ref >90)
GLUCOSE: 87 mg/dL (ref 70–99)
POTASSIUM: 4.1 meq/L (ref 3.7–5.3)
Sodium: 137 mEq/L (ref 137–147)

## 2014-01-11 LAB — CBC
HCT: 37.4 % — ABNORMAL LOW (ref 39.0–52.0)
HEMOGLOBIN: 13.2 g/dL (ref 13.0–17.0)
MCH: 30.9 pg (ref 26.0–34.0)
MCHC: 35.3 g/dL (ref 30.0–36.0)
MCV: 87.6 fL (ref 78.0–100.0)
Platelets: 202 10*3/uL (ref 150–400)
RBC: 4.27 MIL/uL (ref 4.22–5.81)
RDW: 13.6 % (ref 11.5–15.5)
WBC: 5.6 10*3/uL (ref 4.0–10.5)

## 2014-01-11 LAB — I-STAT TROPONIN, ED: Troponin i, poc: 0.01 ng/mL (ref 0.00–0.08)

## 2014-01-11 LAB — PRO B NATRIURETIC PEPTIDE: Pro B Natriuretic peptide (BNP): 126.8 pg/mL (ref 0–450)

## 2014-01-11 MED ORDER — ALBUTEROL SULFATE (2.5 MG/3ML) 0.083% IN NEBU
5.0000 mg | INHALATION_SOLUTION | Freq: Once | RESPIRATORY_TRACT | Status: AC
  Administered 2014-01-11: 5 mg via RESPIRATORY_TRACT
  Filled 2014-01-11: qty 6

## 2014-01-11 MED ORDER — PREDNISONE 50 MG PO TABS: ORAL_TABLET | ORAL | Status: DC

## 2014-01-11 MED ORDER — AEROCHAMBER PLUS FLO-VU SMALL MISC
1.0000 | Freq: Once | Status: AC
  Administered 2014-01-11: 1
  Filled 2014-01-11: qty 1

## 2014-01-11 MED ORDER — AEROCHAMBER PLUS W/MASK MISC
Status: AC
  Filled 2014-01-11: qty 1

## 2014-01-11 MED ORDER — METHYLPREDNISOLONE SODIUM SUCC 125 MG IJ SOLR
125.0000 mg | Freq: Once | INTRAMUSCULAR | Status: AC
  Administered 2014-01-11: 125 mg via INTRAVENOUS
  Filled 2014-01-11: qty 2

## 2014-01-11 NOTE — ED Notes (Signed)
Pt in c/o sinus pain and congestion, nasal congestion, and mild cough since yesterday, states he thinks it is allergies

## 2014-01-11 NOTE — ED Provider Notes (Signed)
CSN: [id number]     Arrival date & time 01/11/14  1329 History   First MD Initiated Contact with Patient 01/11/14 1338     Chief Complaint  Patient presents with  . Cough  . Facial Pain     (Consider location/radiation/quality/duration/timing/severity/associated sxs/prior Treatment) Patient is a 80 y.o. male presenting with cough. The history is provided by the patient.  Cough Cough characteristics:  Productive Sputum characteristics:  White Severity:  Moderate Onset quality:  Gradual Duration:  1 week Timing:  Constant Progression:  Worsening Chronicity:  New Smoker: former smoker.   Context: weather changes   Relieved by:  Nothing Worsened by:  Nothing tried Ineffective treatments:  Cough suppressants and beta-agonist inhaler Associated symptoms: shortness of breath, sinus congestion and wheezing   Associated symptoms: no chest pain, no fever, no headaches and no rhinorrhea     Past Medical History  Diagnosis Date  . Asthma    Past Surgical History  Procedure Laterality Date  . Hernia repair    . Knee arthroscopy      knee surgery in the army   History reviewed. No pertinent family history. History  Substance Use Topics  . Smoking status: Former Games developer  . Smokeless tobacco: Not on file  . Alcohol Use: No    Review of Systems  Constitutional: Negative for fever.  HENT: Positive for congestion and sneezing. Negative for drooling and rhinorrhea.   Eyes: Negative for pain.  Respiratory: Positive for shortness of breath and wheezing. Negative for cough.   Cardiovascular: Negative for chest pain and leg swelling.  Gastrointestinal: Positive for abdominal pain (only w/ coughing). Negative for nausea, vomiting and diarrhea.  Genitourinary: Negative for dysuria and hematuria.  Musculoskeletal: Negative for gait problem and neck pain.  Skin: Negative for color change.  Neurological: Negative for numbness and headaches.  Hematological: Negative for adenopathy.   Psychiatric/Behavioral: Negative for behavioral problems.  All other systems reviewed and are negative.      Allergies  Review of patient's allergies indicates no known allergies.  Home Medications   Current Outpatient Rx  Name  Route  Sig  Dispense  Refill  . albuterol (PROVENTIL HFA;VENTOLIN HFA) 108 (90 BASE) MCG/ACT inhaler   Inhalation   Inhale 2 puffs into the lungs every 6 (six) hours as needed. For wheezing         . guaiFENesin-dextromethorphan (ROBITUSSIN DM) 100-10 MG/5ML syrup   Oral   Take 5 mLs by mouth every 4 (four) hours as needed for cough.         . montelukast (SINGULAIR) 10 MG tablet   Oral   Take 10 mg by mouth at bedtime.         Bertram Gala Glycol-Propyl Glycol (SYSTANE) 0.4-0.3 % SOLN   Ophthalmic   Apply 2 drops to eye daily as needed (for dry eyes).          BP 146/74  Pulse 85  Temp(Src) 98.4 F (36.9 C) (Oral)  Resp 24  Ht 6' (1.829 m)  Wt 240 lb (108.863 kg)  BMI 32.54 kg/m2  SpO2 94% Physical Exam  Nursing note and vitals reviewed. Constitutional: He is oriented to person, place, and time. He appears well-developed and well-nourished.  HENT:  Head: Normocephalic and atraumatic.  Right Ear: External ear normal.  Left Ear: External ear normal.  Nose: Nose normal.  Mouth/Throat: Oropharynx is clear and moist. No oropharyngeal exudate.  Eyes: Conjunctivae and EOM are normal. Pupils are equal, round, and reactive to light.  Neck: Normal range of motion. Neck supple.  Cardiovascular: Normal rate, regular rhythm, normal heart sounds and intact distal pulses.  Exam reveals no gallop and no friction rub.   No murmur heard. Pulmonary/Chest: No respiratory distress. He has wheezes (mild diffuse).  Abdominal: Soft. Bowel sounds are normal. He exhibits no distension. There is no tenderness. There is no rebound and no guarding.  Musculoskeletal: Normal range of motion. He exhibits no edema and no tenderness.  Neurological: He is alert  and oriented to person, place, and time.  Skin: Skin is warm and dry.  Psychiatric: He has a normal mood and affect. His behavior is normal.    ED Course  Procedures (including critical care time) Labs Review Labs Reviewed  BASIC METABOLIC PANEL - Abnormal; Notable for the following:    GFR calc non Af Amer 77 (*)    GFR calc Af Amer 90 (*)    All other components within normal limits  CBC - Abnormal; Notable for the following:    HCT 37.4 (*)    All other components within normal limits  PRO B NATRIURETIC PEPTIDE  I-STAT TROPOININ, ED   Imaging Review Dg Chest 2 View (if Patient Has Fever And/or Copd)  01/11/2014   CLINICAL DATA:  COUGH FACIAL PAIN  EXAM: CHEST  2 VIEW  COMPARISON:  NM MYOCAR MULTI w/SPECT w/WALL MOTION / EF dated 06/29/2012; DG CHEST 2 VIEW dated 06/27/2012  FINDINGS: Cardiac silhouette within normal limits. The lungs are hyperinflated. Stable areas of linear scarring right mid lung and lung bases. No focal regions of consolidation or focal infiltrates. No acute osseous abnormalities appreciated.  IMPRESSION: COPD.  Chronic stable areas of scarring.  No acute cardiopulmonary disease.   Electronically Signed   By: Salome HolmesHector  Cooper M.D.   On: 01/11/2014 15:26     EKG Interpretation   Date/Time:  Friday January 11 2014 14:51:42 EDT Ventricular Rate:  66 PR Interval:  151 QRS Duration: 83 QT Interval:  400 QTC Calculation: 419 R Axis:   65 Text Interpretation:  Sinus rhythm No significant change since last  tracing Confirmed by Choya Tornow  MD, Jarren Para ([id number]) on 01/11/2014 3:46:08 PM      MDM   Final diagnoses:  Asthma exacerbation    3:16 PM 80 y.o. male w a hx of asthma who presents with productive cough and shortness of breath for one week. The patient notes that he has had watery eyes, sinus congestion, and cough productive of white sputum. He states that he has some mild pain in his abdomen but only when coughing. He denies any fevers at home. He is afebrile and  vital signs are unremarkable here. He notes a history of similar symptoms with seasonal changes. He has mild diffuse wheezing on exam. Screening imaging and blood work sent prior to arrival which is noncontributory. Will give another breathing treatment and IV steroids.  4:43 PM: I interpreted/reviewed the labs and/or imaging which were non-contributory.  Now only very faint wheeze heard on auscultation. Pt feeling much better. Will place on po steroids. Likely asthma exac. Will give spacer for his inhaler. I have discussed the diagnosis/risks/treatment options with the patient and believe the pt to be eligible for discharge home to follow-up with pcp as needed. We also discussed returning to the ED immediately if new or worsening sx occur. We discussed the sx which are most concerning (e.g., worsening sob, cp, fever, worsening cough) that necessitate immediate return. Medications administered to the patient during their visit  and any new prescriptions provided to the patient are listed below.  Medications given during this visit Medications  AEROCHAMBER PLUS FLO-VU SMALL device MISC 1 each (not administered)  albuterol (PROVENTIL) (2.5 MG/3ML) 0.083% nebulizer solution 5 mg (5 mg Nebulization Given 01/11/14 1444)  methylPREDNISolone sodium succinate (SOLU-MEDROL) 125 mg/2 mL injection 125 mg (125 mg Intravenous Given 01/11/14 1555)  albuterol (PROVENTIL) (2.5 MG/3ML) 0.083% nebulizer solution 5 mg (5 mg Nebulization Given 01/11/14 1555)    New Prescriptions   PREDNISONE (DELTASONE) 50 MG TABLET    Take 1 tablet by mouth daily for the next 4 days.     Junius Argyle, MD 01/11/14 [phone]

## 2014-01-11 NOTE — Discharge Instructions (Signed)
Asthma, Adult Asthma is a recurring condition in which the airways tighten and narrow. Asthma can make it difficult to breathe. It can cause coughing, wheezing, and shortness of breath. Asthma episodes (also called asthma attacks) range from minor to life-threatening. Asthma cannot be cured, but medicines and lifestyle changes can help control it. CAUSES Asthma is believed to be caused by inherited (genetic) and environmental factors, but its exact cause is unknown. Asthma may be triggered by allergens, lung infections, or irritants in the air. Asthma triggers are different for each person. Common triggers include:   Animal dander.  Dust mites.  Cockroaches.  Pollen from trees or grass.  Mold.  Smoke.  Air pollutants such as dust, household cleaners, hair sprays, aerosol sprays, paint fumes, strong chemicals, or strong odors.  Cold air, weather changes, and winds (which increase molds and pollens in the air).  Strong emotional expressions such as crying or laughing hard.  Stress.  Certain medicines (such as aspirin) or types of drugs (such as beta-blockers).  Sulfites in foods and drinks. Foods and drinks that may contain sulfites include dried fruit, potato chips, and sparkling grape juice.  Infections or inflammatory conditions such as the flu, a cold, or an inflammation of the nasal membranes (rhinitis).  Gastroesophageal reflux disease (GERD).  Exercise or strenuous activity. SYMPTOMS Symptoms may occur immediately after asthma is triggered or many hours later. Symptoms include:  Wheezing.  Excessive nighttime or early morning coughing.  Frequent or severe coughing with a common cold.  Chest tightness.  Shortness of breath. DIAGNOSIS  The diagnosis of asthma is made by a review of your medical history and a physical exam. Tests may also be performed. These may include:  Lung function studies. These tests show how much air you breath in and out.  Allergy  tests.  Imaging tests such as X-rays. TREATMENT  Asthma cannot be cured, but it can usually be controlled. Treatment involves identifying and avoiding your asthma triggers. It also involves medicines. There are 2 classes of medicine used for asthma treatment:   Controller medicines. These prevent asthma symptoms from occurring. They are usually taken every day.  Reliever or rescue medicines. These quickly relieve asthma symptoms. They are used as needed and provide short-term relief. Your health care provider will help you create an asthma action plan. An asthma action plan is a written plan for managing and treating your asthma attacks. It includes a list of your asthma triggers and how they may be avoided. It also includes information on when medicines should be taken and when their dosage should be changed. An action plan may also involve the use of a device called a peak flow meter. A peak flow meter measures how well the lungs are working. It helps you monitor your condition. HOME CARE INSTRUCTIONS   Take medicine as directed by your health care provider. Speak with your health care provider if you have questions about how or when to take the medicines.  Use a peak flow meter as directed by your health care provider. Record and keep track of readings.  Understand and use the action plan to help minimize or stop an asthma attack without needing to seek medical care.  Control your home environment in the following ways to help prevent asthma attacks:  Do not smoke. Avoid being exposed to secondhand smoke.  Change your heating and air conditioning filter regularly.  Limit your use of fireplaces and wood stoves.  Get rid of pests (such as roaches and   mice) and their droppings.  Throw away plants if you see mold on them.  Clean your floors and dust regularly. Use unscented cleaning products.  Try to have someone else vacuum for you regularly. Stay out of rooms while they are being  vacuumed and for a short while afterward. If you vacuum, use a dust mask from a hardware store, a double-layered or microfilter vacuum cleaner bag, or a vacuum cleaner with a HEPA filter.  Replace carpet with wood, tile, or vinyl flooring. Carpet can trap dander and dust.  Use allergy-proof pillows, mattress covers, and box spring covers.  Wash bed sheets and blankets every week in hot water and dry them in a dryer.  Use blankets that are made of polyester or cotton.  Clean bathrooms and kitchens with bleach. If possible, have someone repaint the walls in these rooms with mold-resistant paint. Keep out of the rooms that are being cleaned and painted.  Wash hands frequently. SEEK MEDICAL CARE IF:   You have wheezing, shortness of breath, or a cough even if taking medicine to prevent attacks.  The colored mucus you cough up (sputum) is thicker than usual.  Your sputum changes from clear or white to yellow, green, gray, or bloody.  You have any problems that may be related to the medicines you are taking (such as a rash, itching, swelling, or trouble breathing).  You are using a reliever medicine more than 2 3 times per week.  Your peak flow is still at 50 79% of you personal best after following your action plan for 1 hour. SEEK IMMEDIATE MEDICAL CARE IF:   You seem to be getting worse and are unresponsive to treatment during an asthma attack.  You are short of breath even at rest.  You get short of breath when doing very little physical activity.  You have difficulty eating, drinking, or talking due to asthma symptoms.  You develop chest pain.  You develop a fast heartbeat.  You have a bluish color to your lips or fingernails.  You are lightheaded, dizzy, or faint.  Your peak flow is less than 50% of your personal best.  You have a fever or persistent symptoms for more than 2 3 days.  You have a fever and symptoms suddenly get worse. MAKE SURE YOU:   Understand these  instructions.  Will watch your condition.  Will get help right away if you are not doing well or get worse. Document Released: 09/27/2005 Document Revised: 05/30/2013 Document Reviewed: 04/26/2013 [employer] Patient Information 2014 [employer].  

## 2014-10-07 ENCOUNTER — Emergency Department (HOSPITAL_COMMUNITY): Payer: No Typology Code available for payment source

## 2014-10-07 ENCOUNTER — Emergency Department (HOSPITAL_COMMUNITY)
Admission: EM | Admit: 2014-10-07 | Discharge: 2014-10-07 | Disposition: A | Discharge status: Home / Self Care | Payer: No Typology Code available for payment source | Attending: Emergency Medicine | Admitting: Emergency Medicine

## 2014-10-07 ENCOUNTER — Encounter (HOSPITAL_COMMUNITY): Payer: Self-pay | Admitting: Family Medicine

## 2014-10-07 DIAGNOSIS — S0990XA Unspecified injury of head, initial encounter: Secondary | ICD-10-CM | POA: Diagnosis present

## 2014-10-07 DIAGNOSIS — Y9389 Activity, other specified: Secondary | ICD-10-CM | POA: Diagnosis not present

## 2014-10-07 DIAGNOSIS — Y92410 Unspecified street and highway as the place of occurrence of the external cause: Secondary | ICD-10-CM | POA: Diagnosis not present

## 2014-10-07 DIAGNOSIS — S199XXA Unspecified injury of neck, initial encounter: Secondary | ICD-10-CM | POA: Insufficient documentation

## 2014-10-07 DIAGNOSIS — S3992XA Unspecified injury of lower back, initial encounter: Secondary | ICD-10-CM | POA: Insufficient documentation

## 2014-10-07 DIAGNOSIS — R519 Headache, unspecified: Secondary | ICD-10-CM

## 2014-10-07 DIAGNOSIS — Z79899 Other long term (current) drug therapy: Secondary | ICD-10-CM | POA: Insufficient documentation

## 2014-10-07 DIAGNOSIS — Z87891 Personal history of nicotine dependence: Secondary | ICD-10-CM | POA: Diagnosis not present

## 2014-10-07 DIAGNOSIS — Z043 Encounter for examination and observation following other accident: Secondary | ICD-10-CM | POA: Diagnosis not present

## 2014-10-07 DIAGNOSIS — Z041 Encounter for examination and observation following transport accident: Secondary | ICD-10-CM

## 2014-10-07 DIAGNOSIS — J45909 Unspecified asthma, uncomplicated: Secondary | ICD-10-CM | POA: Diagnosis not present

## 2014-10-07 DIAGNOSIS — Y998 Other external cause status: Secondary | ICD-10-CM | POA: Insufficient documentation

## 2014-10-07 DIAGNOSIS — R51 Headache: Secondary | ICD-10-CM

## 2014-10-07 MED ORDER — IBUPROFEN 400 MG PO TABS
600.0000 mg | ORAL_TABLET | Freq: Once | ORAL | Status: AC
  Administered 2014-10-07: 600 mg via ORAL
  Filled 2014-10-07 (×2): qty 1

## 2014-10-07 MED ORDER — IBUPROFEN 800 MG PO TABS
800.0000 mg | ORAL_TABLET | Freq: Once | ORAL | Status: DC
Start: 2014-10-07 — End: 2014-10-07

## 2014-10-07 MED ORDER — LORAZEPAM 2 MG/ML IJ SOLN: 1.0000 mg | Freq: Once | INTRAMUSCULAR | Status: DC
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
## 2014-10-07 NOTE — ED Notes (Signed)
Pt returned from CT °
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
## 2014-10-12 ENCOUNTER — Encounter (HOSPITAL_COMMUNITY): Payer: Self-pay | Admitting: *Deleted

## 2014-10-12 ENCOUNTER — Emergency Department (HOSPITAL_COMMUNITY)
Admission: EM | Admit: 2014-10-12 | Discharge: 2014-10-12 | Disposition: A | Discharge status: Home / Self Care | Payer: Medicare Other | Attending: Emergency Medicine | Admitting: Emergency Medicine

## 2014-10-12 ENCOUNTER — Emergency Department (HOSPITAL_COMMUNITY): Payer: Medicare Other

## 2014-10-12 DIAGNOSIS — Z87891 Personal history of nicotine dependence: Secondary | ICD-10-CM | POA: Diagnosis not present

## 2014-10-12 DIAGNOSIS — R51 Headache: Secondary | ICD-10-CM | POA: Insufficient documentation

## 2014-10-12 DIAGNOSIS — G8911 Acute pain due to trauma: Secondary | ICD-10-CM | POA: Insufficient documentation

## 2014-10-12 DIAGNOSIS — Z79899 Other long term (current) drug therapy: Secondary | ICD-10-CM | POA: Diagnosis not present

## 2014-10-12 DIAGNOSIS — J45909 Unspecified asthma, uncomplicated: Secondary | ICD-10-CM | POA: Diagnosis not present

## 2014-10-12 DIAGNOSIS — M25512 Pain in left shoulder: Secondary | ICD-10-CM | POA: Diagnosis present

## 2014-10-12 DIAGNOSIS — M542 Cervicalgia: Secondary | ICD-10-CM | POA: Insufficient documentation

## 2014-10-12 MED ORDER — KETOROLAC TROMETHAMINE 15 MG/ML IJ SOLN
15.0000 mg | Freq: Once | INTRAMUSCULAR | Status: AC
  Administered 2014-10-12: 15 mg via INTRAVENOUS
  Filled 2014-10-12: qty 1
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
## 2014-10-12 NOTE — Discharge Instructions (Signed)
Arthritis, Nonspecific °Arthritis is inflammation of a joint. This usually means pain, redness, warmth or swelling are present. One or more joints may be involved. There are a number of types of arthritis. Your caregiver may not be able to tell what type of arthritis you have right away. °CAUSES  °The most common cause of arthritis is the wear and tear on the joint (osteoarthritis). This causes damage to the cartilage, which can break down over time. The knees, hips, back and neck are most often affected by this type of arthritis. °Other types of arthritis and common causes of joint pain include: °· Sprains and other injuries near the joint. Sometimes minor sprains and injuries cause pain and swelling that develop hours later. °· Rheumatoid arthritis. This affects hands, feet and knees. It usually affects both sides of your body at the same time. It is often associated with chronic ailments, fever, weight loss and general weakness. °· Crystal arthritis. Gout and pseudo gout can cause occasional acute severe pain, redness and swelling in the foot, ankle, or knee. °· Infectious arthritis. Bacteria can get into a joint through a break in overlying skin. This can cause infection of the joint. Bacteria and viruses can also spread through the blood and affect your joints. °· Drug, infectious and allergy reactions. Sometimes joints can become mildly painful and slightly swollen with these types of illnesses. °SYMPTOMS  °· Pain is the main symptom. °· Your joint or joints can also be red, swollen and warm or hot to the touch. °· You may have a fever with certain types of arthritis, or even feel overall ill. °· The joint with arthritis will hurt with movement. Stiffness is present with some types of arthritis. °DIAGNOSIS  °Your caregiver will suspect arthritis based on your description of your symptoms and on your exam. Testing may be needed to find the type of arthritis: °· Blood and sometimes urine tests. °· X-ray tests  and sometimes CT or MRI scans. °· Removal of fluid from the joint (arthrocentesis) is done to check for bacteria, crystals or other causes. Your caregiver (or a specialist) will numb the area over the joint with a local anesthetic, and use a needle to remove joint fluid for examination. This procedure is only minimally uncomfortable. °· Even with these tests, your caregiver may not be able to tell what kind of arthritis you have. Consultation with a specialist (rheumatologist) may be helpful. °TREATMENT  °Your caregiver will discuss with you treatment specific to your type of arthritis. If the specific type cannot be determined, then the following general recommendations may apply. °Treatment of severe joint pain includes: °· Rest. °· Elevation. °· Anti-inflammatory medication (for example, ibuprofen) may be prescribed. Avoiding activities that cause increased pain. °· Only take over-the-counter or prescription medicines for pain and discomfort as recommended by your caregiver. °· Cold packs over an inflamed joint may be used for 10 to 15 minutes every hour. Hot packs sometimes feel better, but do not use overnight. Do not use hot packs if you are diabetic without your caregiver's permission. °· A cortisone shot into arthritic joints may help reduce pain and swelling. °· Any acute arthritis that gets worse over the next 1 to 2 days needs to be looked at to be sure there is no joint infection. °Long-term arthritis treatment involves modifying activities and lifestyle to reduce joint stress jarring. This can include weight loss. Also, exercise is needed to nourish the joint cartilage and remove waste. This helps keep the muscles   around the joint strong. °HOME CARE INSTRUCTIONS  °· Do not take aspirin to relieve pain if gout is suspected. This elevates uric acid levels. °· Only take over-the-counter or prescription medicines for pain, discomfort or fever as directed by your caregiver. °· Rest the joint as much as  possible. °· If your joint is swollen, keep it elevated. °· Use crutches if the painful joint is in your leg. °· Drinking plenty of fluids may help for certain types of arthritis. °· Follow your caregiver's dietary instructions. °· Try low-impact exercise such as: °¨ Swimming. °¨ Water aerobics. °¨ Biking. °¨ Walking. °· Morning stiffness is often relieved by a warm shower. °· Put your joints through regular range-of-motion. °SEEK MEDICAL CARE IF:  °· You do not feel better in 24 hours or are getting worse. °· You have side effects to medications, or are not getting better with treatment. °SEEK IMMEDIATE MEDICAL CARE IF:  °· You have a fever. °· You develop severe joint pain, swelling or redness. °· Many joints are involved and become painful and swollen. °· There is severe back pain and/or leg weakness. °· You have loss of bowel or bladder control. °Document Released: 11/04/2004 Document Revised: 12/20/2011 Document Reviewed: 11/20/2008 °[employer]® Patient Information ©2015 [employer]. This information is not intended to replace advice given to you by your health care provider. Make sure you discuss any questions you have with your health care provider. ° °

## 2015-05-20 ENCOUNTER — Emergency Department (HOSPITAL_COMMUNITY)
Admission: EM | Admit: 2015-05-20 | Discharge: 2015-05-20 | Discharge status: Against Medical Advice | Payer: BC Managed Care – PPO | Attending: Emergency Medicine | Admitting: Emergency Medicine

## 2015-05-20 ENCOUNTER — Encounter (HOSPITAL_COMMUNITY): Payer: Self-pay

## 2015-05-20 DIAGNOSIS — R21 Rash and other nonspecific skin eruption: Secondary | ICD-10-CM | POA: Diagnosis not present

## 2015-05-20 DIAGNOSIS — R2243 Localized swelling, mass and lump, lower limb, bilateral: Secondary | ICD-10-CM | POA: Insufficient documentation

## 2015-05-20 DIAGNOSIS — J45909 Unspecified asthma, uncomplicated: Secondary | ICD-10-CM | POA: Diagnosis not present

## 2015-05-20 NOTE — ED Notes (Signed)
No response when pt called to go back to room. Pt had told nurse first earlier that he would have to leave by 8:30p-8:45p if not called back before then.

## 2015-05-20 NOTE — ED Notes (Signed)
Pt reports onset 3 weeks widespread raised red rash on trunk and arms.  Redness and swelling to bilateral legs.  Itching.  Anterior right lower leg weeping clear fluid.

## 2015-05-20 NOTE — ED Notes (Signed)
Pt called, no answerx1 

## 2015-05-29 ENCOUNTER — Emergency Department (HOSPITAL_COMMUNITY): Payer: No Typology Code available for payment source

## 2015-05-29 ENCOUNTER — Emergency Department (HOSPITAL_COMMUNITY)
Admission: EM | Admit: 2015-05-29 | Discharge: 2015-05-29 | Disposition: A | Discharge status: Home / Self Care | Payer: No Typology Code available for payment source | Attending: Emergency Medicine | Admitting: Emergency Medicine

## 2015-05-29 ENCOUNTER — Encounter (HOSPITAL_COMMUNITY): Payer: Self-pay | Admitting: *Deleted

## 2015-05-29 DIAGNOSIS — J45909 Unspecified asthma, uncomplicated: Secondary | ICD-10-CM | POA: Diagnosis not present

## 2015-05-29 DIAGNOSIS — Y92410 Unspecified street and highway as the place of occurrence of the external cause: Secondary | ICD-10-CM | POA: Diagnosis not present

## 2015-05-29 DIAGNOSIS — M25579 Pain in unspecified ankle and joints of unspecified foot: Secondary | ICD-10-CM

## 2015-05-29 DIAGNOSIS — Z91040 Latex allergy status: Secondary | ICD-10-CM | POA: Diagnosis not present

## 2015-05-29 DIAGNOSIS — Y9389 Activity, other specified: Secondary | ICD-10-CM | POA: Diagnosis not present

## 2015-05-29 DIAGNOSIS — Z87891 Personal history of nicotine dependence: Secondary | ICD-10-CM | POA: Insufficient documentation

## 2015-05-29 DIAGNOSIS — Y998 Other external cause status: Secondary | ICD-10-CM | POA: Insufficient documentation

## 2015-05-29 DIAGNOSIS — Z79899 Other long term (current) drug therapy: Secondary | ICD-10-CM | POA: Diagnosis not present

## 2015-05-29 DIAGNOSIS — S20219A Contusion of unspecified front wall of thorax, initial encounter: Secondary | ICD-10-CM | POA: Diagnosis not present

## 2015-05-29 DIAGNOSIS — S99911A Unspecified injury of right ankle, initial encounter: Secondary | ICD-10-CM | POA: Insufficient documentation

## 2015-05-29 DIAGNOSIS — S99912A Unspecified injury of left ankle, initial encounter: Secondary | ICD-10-CM | POA: Insufficient documentation

## 2015-05-29 DIAGNOSIS — S29001A Unspecified injury of muscle and tendon of front wall of thorax, initial encounter: Secondary | ICD-10-CM | POA: Diagnosis present

## 2015-05-29 MED ORDER — NAPROXEN 375 MG PO TABS: 375.0000 mg | ORAL_TABLET | Freq: Two times a day (BID) | ORAL | Status: DC
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
## 2015-08-10 ENCOUNTER — Emergency Department (HOSPITAL_COMMUNITY)
Admission: EM | Admit: 2015-08-10 | Discharge: 2015-08-10 | Disposition: A | Discharge status: Home / Self Care | Payer: No Typology Code available for payment source | Attending: Emergency Medicine | Admitting: Emergency Medicine

## 2015-08-10 ENCOUNTER — Encounter (HOSPITAL_COMMUNITY): Payer: Self-pay | Admitting: *Deleted

## 2015-08-10 ENCOUNTER — Emergency Department (HOSPITAL_COMMUNITY): Payer: No Typology Code available for payment source

## 2015-08-10 DIAGNOSIS — M1712 Unilateral primary osteoarthritis, left knee: Secondary | ICD-10-CM | POA: Insufficient documentation

## 2015-08-10 DIAGNOSIS — J45909 Unspecified asthma, uncomplicated: Secondary | ICD-10-CM | POA: Diagnosis not present

## 2015-08-10 DIAGNOSIS — Z79899 Other long term (current) drug therapy: Secondary | ICD-10-CM | POA: Diagnosis not present

## 2015-08-10 DIAGNOSIS — S3992XA Unspecified injury of lower back, initial encounter: Secondary | ICD-10-CM | POA: Diagnosis present

## 2015-08-10 DIAGNOSIS — Z791 Long term (current) use of non-steroidal anti-inflammatories (NSAID): Secondary | ICD-10-CM | POA: Diagnosis not present

## 2015-08-10 DIAGNOSIS — Y92410 Unspecified street and highway as the place of occurrence of the external cause: Secondary | ICD-10-CM | POA: Insufficient documentation

## 2015-08-10 DIAGNOSIS — M25562 Pain in left knee: Secondary | ICD-10-CM

## 2015-08-10 DIAGNOSIS — Z87891 Personal history of nicotine dependence: Secondary | ICD-10-CM | POA: Diagnosis not present

## 2015-08-10 DIAGNOSIS — S39012A Strain of muscle, fascia and tendon of lower back, initial encounter: Secondary | ICD-10-CM

## 2015-08-10 DIAGNOSIS — Y999 Unspecified external cause status: Secondary | ICD-10-CM | POA: Diagnosis not present

## 2015-08-10 DIAGNOSIS — Z91040 Latex allergy status: Secondary | ICD-10-CM | POA: Diagnosis not present

## 2015-08-10 DIAGNOSIS — S8992XA Unspecified injury of left lower leg, initial encounter: Secondary | ICD-10-CM | POA: Insufficient documentation

## 2015-08-10 DIAGNOSIS — Y9389 Activity, other specified: Secondary | ICD-10-CM | POA: Diagnosis not present

## 2015-08-10 MED ORDER — NAPROXEN 500 MG PO TABS: 500.0000 mg | ORAL_TABLET | Freq: Two times a day (BID) | ORAL | Status: DC

## 2015-08-10 MED ORDER — TRAMADOL HCL 50 MG PO TABS: 50.0000 mg | ORAL_TABLET | Freq: Four times a day (QID) | ORAL | Status: DC | PRN

## 2015-08-10 MED ORDER — METHOCARBAMOL 500 MG PO TABS: 500.0000 mg | ORAL_TABLET | Freq: Two times a day (BID) | ORAL | Status: DC

## 2015-08-10 NOTE — ED Notes (Signed)
Declined W/C at D/C and was escorted to lobby by RN. 
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
## 2016-01-19 ENCOUNTER — Emergency Department (HOSPITAL_COMMUNITY)
Admission: EM | Admit: 2016-01-19 | Discharge: 2016-01-19 | Disposition: A | Discharge status: Home / Self Care | Payer: BC Managed Care – PPO | Attending: Emergency Medicine | Admitting: Emergency Medicine

## 2016-01-19 ENCOUNTER — Emergency Department (HOSPITAL_COMMUNITY): Payer: BC Managed Care – PPO

## 2016-01-19 ENCOUNTER — Encounter (HOSPITAL_COMMUNITY): Payer: Self-pay | Admitting: Emergency Medicine

## 2016-01-19 DIAGNOSIS — J45901 Unspecified asthma with (acute) exacerbation: Secondary | ICD-10-CM | POA: Diagnosis not present

## 2016-01-19 DIAGNOSIS — Z91040 Latex allergy status: Secondary | ICD-10-CM | POA: Insufficient documentation

## 2016-01-19 DIAGNOSIS — Z87891 Personal history of nicotine dependence: Secondary | ICD-10-CM | POA: Insufficient documentation

## 2016-01-19 DIAGNOSIS — Z79899 Other long term (current) drug therapy: Secondary | ICD-10-CM | POA: Insufficient documentation

## 2016-01-19 DIAGNOSIS — R0602 Shortness of breath: Secondary | ICD-10-CM | POA: Diagnosis present

## 2016-01-19 MED ORDER — PREDNISONE 50 MG PO TABS: ORAL_TABLET | ORAL | Status: DC

## 2016-01-19 MED ORDER — IPRATROPIUM-ALBUTEROL 0.5-2.5 (3) MG/3ML IN SOLN
3.0000 mL | Freq: Once | RESPIRATORY_TRACT | Status: AC
  Administered 2016-01-19: 3 mL via RESPIRATORY_TRACT
  Filled 2016-01-19: qty 3

## 2016-01-19 MED ORDER — PREDNISONE 20 MG PO TABS: 50.0000 mg | ORAL_TABLET | Freq: Once | ORAL | Status: DC

## 2016-01-19 MED ORDER — ALBUTEROL (5 MG/ML) CONTINUOUS INHALATION SOLN
10.0000 mg/h | INHALATION_SOLUTION | Freq: Once | RESPIRATORY_TRACT | Status: AC
  Administered 2016-01-19: 10 mg/h via RESPIRATORY_TRACT
  Filled 2016-01-19: qty 20

## 2016-01-19 NOTE — ED Notes (Signed)
Pt in reporting SOB started yesterday. Denies any other S/S. Breathing noted to be labored, tachypnea. Hx asthma. No CHF,  No COPD. Denies smoking

## 2016-01-19 NOTE — ED Provider Notes (Signed)
CSN: [id number]     Arrival date & time 01/19/16  0605 History   First MD Initiated Contact with Patient 01/19/16 [phone]     Chief Complaint  Patient presents with  . Shortness of Breath    HPI   82 year old male presents today with complaints of shortness of breath. Patient reports a significant past medical history of asthma exacerbations. He reports proximally once for a year he has an exacerbation that leads to hospitalization. Patient has never had any intubations, severe respiratory collapse. Patient notes that yesterday he began have wheezing, dry nonproductive you're to cough. Patient reports using albuterol nebulized treatment prior to arrival with no improvement in symptoms. Patient denies any fever, chills, chest pain, abdominal pain, exposure to any known exacerbating factor. Patient reports he is a nonsmoker, no history of COPD. Patient also notes he takes Singulair at night daily. Patient denies any lower shortness swelling or edema, history of DVT or PE.  Past Medical History  Diagnosis Date  . Asthma    Past Surgical History  Procedure Laterality Date  . Hernia repair    . Knee arthroscopy      knee surgery in the army   No family history on file. Social History  Substance Use Topics  . Smoking status: Former Games developer  . Smokeless tobacco: None  . Alcohol Use: No    Review of Systems  All other systems reviewed and are negative.   Allergies  Latex  Home Medications   Prior to Admission medications   Medication Sig Start Date End Date Taking? Authorizing Provider  albuterol (PROVENTIL HFA;VENTOLIN HFA) 108 (90 BASE) MCG/ACT inhaler Inhale 2 puffs into the lungs every 6 (six) hours as needed for wheezing or shortness of breath.    Yes Historical Provider, MD  montelukast (SINGULAIR) 10 MG tablet Take 10 mg by mouth at bedtime.   Yes Historical Provider, MD  methocarbamol (ROBAXIN) 500 MG tablet Take 1 tablet (500 mg total) by mouth 2 (two) times daily. Patient not  taking: Reported on 01/19/2016 08/10/15   Tatyana Kirichenko, PA-C  naproxen (NAPROSYN) 500 MG tablet Take 1 tablet (500 mg total) by mouth 2 (two) times daily. Patient not taking: Reported on 01/19/2016 08/10/15   Tatyana Kirichenko, PA-C  predniSONE (DELTASONE) 50 MG tablet Take 1 tablet by mouth daily for the next 4 days. 01/19/16   Eyvonne Mechanic, PA-C  traMADol (ULTRAM) 50 MG tablet Take 1 tablet (50 mg total) by mouth every 6 (six) hours as needed. Patient not taking: Reported on 01/19/2016 08/10/15   Tatyana Kirichenko, PA-C   BP 178/95 mmHg  Pulse 67  Temp(Src) 97.8 F (36.6 C) (Oral)  Resp 22  Ht 6' (1.829 m)  Wt 113.399 kg  BMI 33.90 kg/m2  SpO2 97%   Physical Exam  Constitutional: He is oriented to person, place, and time. He appears well-developed and well-nourished.  HENT:  Head: Normocephalic and atraumatic.  Eyes: Conjunctivae are normal. Pupils are equal, round, and reactive to light. Right eye exhibits no discharge. Left eye exhibits no discharge. No scleral icterus.  Neck: Normal range of motion. No JVD present. No tracheal deviation present.  Pulmonary/Chest: Effort normal. No stridor. He has wheezes.  Neurological: He is alert and oriented to person, place, and time. Coordination normal.  Psychiatric: He has a normal mood and affect. His behavior is normal. Judgment and thought content normal.  Nursing note and vitals reviewed.   ED Course  Procedures (including critical care time) Labs Review Labs  Reviewed - No data to display  Imaging Review Dg Chest 2 View  01/19/2016  CLINICAL DATA:  Dyspnea and wheezing since yesterday. EXAM: CHEST  2 VIEW COMPARISON:  05/29/2015 FINDINGS: There is moderate hyperinflation, unchanged. Mildly coarsened interstitium, likely chronic, also unchanged. No consolidation. No effusions. Normal pulmonary vasculature. Hilar and mediastinal contours are unremarkable and unchanged. IMPRESSION: Hyperinflation.  No acute findings.  Electronically Signed   By: Ellery Plunkaniel R Mitchell M.D.   On: 01/19/2016 06:49   I have personally reviewed and evaluated these images and lab results as part of my medical decision-making.   EKG Interpretation   Date/Time:  Monday January 19 2016 06:16:07 EDT Ventricular Rate:  70 PR Interval:  151 QRS Duration: 101 QT Interval:  409 QTC Calculation: 441 R Axis:   64 Text Interpretation:  Sinus rhythm Borderline T wave abnormalities  Baseline wander in lead(s) V6 No significant change since last tracing in  2016 Confirmed by WARD,  DO, KRISTEN ([id number]) on 01/19/2016 6:31:17 AM      MDM   Final diagnoses:  Asthma exacerbation    Labs:    Imaging: DG chest 2 view-   Consults:  Therapeutics: DuoNeb, albuterol  Discharge Meds:   Assessment/Plan:82 year old male presents today with complaints of asthma exacerbation. Patient had wheezes throughout upon my initial evaluation. He was given a DuoNeb, wheeze improved but was not resolved. Patient received hour-long albuterol treatment here in the ED which provided symptomatic improvement. Patient denied any shortness of breath, chest pain, fever, chills, or any other concerning signs or symptoms. Patient continued to have minor wheeze, but due to his reassuring vital signs and clinical appearance he was stable for discharge. Patient was given prescription for steroids, encouraged to use his rescue inhaler every 4 hours needed, and follow-up with his primary care provider for reevaluation. Patient was given strict return precautions, he verbalized understanding and agreement today's plan had no further questions or concerns at time of discharge        Eyvonne MechanicJeffrey Keona Bilyeu, PA-C 01/19/16 1612  Azalia BilisKevin Campos, MD 01/21/16 1642

## 2016-01-19 NOTE — Discharge Instructions (Signed)
Asthma, Adult Asthma is a condition of the lungs in which the airways tighten and narrow. Asthma can make it hard to breathe. Asthma cannot be cured, but medicine and lifestyle changes can help control it. Asthma may be started (triggered) by:  Animal skin flakes (dander).  Dust.  Cockroaches.  Pollen.  Mold.  Smoke.  Cleaning products.  Hair sprays or aerosol sprays.  Paint fumes or strong smells.  Cold air, weather changes, and winds.  Crying or laughing hard.  Stress.  Certain medicines or drugs.  Foods, such as dried fruit, potato chips, and sparkling grape juice.  Infections or conditions (colds, flu).  Exercise.  Certain medical conditions or diseases.  Exercise or tiring activities. HOME CARE   Take medicine as told by your doctor.  Use a peak flow meter as told by your doctor. A peak flow meter is a tool that measures how well the lungs are working.  Record and keep track of the peak flow meter's readings.  Understand and use the asthma action plan. An asthma action plan is a written plan for taking care of your asthma and treating your attacks.  To help prevent asthma attacks:  Do not smoke. Stay away from secondhand smoke.  Change your heating and air conditioning filter often.  Limit your use of fireplaces and wood stoves.  Get rid of pests (such as roaches and mice) and their droppings.  Throw away plants if you see mold on them.  Clean your floors. Dust regularly. Use cleaning products that do not smell.  Have someone vacuum when you are not home. Use a vacuum cleaner with a HEPA filter if possible.  Replace carpet with wood, tile, or vinyl flooring. Carpet can trap animal skin flakes and dust.  Use allergy-proof pillows, mattress covers, and box spring covers.  Wash bed sheets and blankets every week in hot water and dry them in a dryer.  Use blankets that are made of polyester or cotton.  Clean bathrooms and kitchens with bleach.  If possible, have someone repaint the walls in these rooms with mold-resistant paint. Keep out of the rooms that are being cleaned and painted.  Wash hands often. GET HELP IF:  You have make a whistling sound when breaking (wheeze), have shortness of breath, or have a cough even if taking medicine to prevent attacks.  The colored mucus you cough up (sputum) is thicker than usual.  The colored mucus you cough up changes from clear or white to yellow, green, gray, or bloody.  You have problems from the medicine you are taking such as:  A rash.  Itching.  Swelling.  Trouble breathing.  You need reliever medicines more than 2-3 times a week.  Your peak flow measurement is still at 50-79% of your personal best after following the action plan for 1 hour.  You have a fever. GET HELP RIGHT AWAY IF:   You seem to be worse and are not responding to medicine during an asthma attack.  You are short of breath even at rest.  You get short of breath when doing very little activity.  You have trouble eating, drinking, or talking.  You have chest pain.  You have a fast heartbeat.  Your lips or fingernails start to turn blue.  You are light-headed, dizzy, or faint.  Your peak flow is less than 50% of your personal best.   This information is not intended to replace advice given to you by your health care provider. Make sure   you discuss any questions you have with your health care provider.   Document Released: 03/15/2008 Document Revised: 06/18/2015 Document Reviewed: 04/26/2013 Elsevier Interactive Patient Education 2016 [employer]  

## 2016-03-03 ENCOUNTER — Encounter: Payer: Self-pay | Admitting: *Deleted

## 2016-03-09 NOTE — Discharge Instructions (Signed)

## 2016-03-10 ENCOUNTER — Encounter
Admission: RE | Disposition: A | Discharge status: Home / Self Care | Payer: Self-pay | Source: Ambulatory Visit | Attending: Ophthalmology

## 2016-03-10 ENCOUNTER — Ambulatory Visit: Payer: BC Managed Care – PPO | Admitting: Anesthesiology

## 2016-03-10 ENCOUNTER — Encounter: Payer: Self-pay | Admitting: *Deleted

## 2016-03-10 ENCOUNTER — Ambulatory Visit
Admission: RE | Admit: 2016-03-10 | Discharge: 2016-03-10 | Disposition: A | Discharge status: Home / Self Care | Payer: BC Managed Care – PPO | Source: Ambulatory Visit | Attending: Ophthalmology | Admitting: Ophthalmology

## 2016-03-10 DIAGNOSIS — H2511 Age-related nuclear cataract, right eye: Secondary | ICD-10-CM | POA: Insufficient documentation

## 2016-03-10 DIAGNOSIS — Z87891 Personal history of nicotine dependence: Secondary | ICD-10-CM | POA: Diagnosis not present

## 2016-03-10 DIAGNOSIS — J45909 Unspecified asthma, uncomplicated: Secondary | ICD-10-CM | POA: Insufficient documentation

## 2016-03-10 DIAGNOSIS — H269 Unspecified cataract: Secondary | ICD-10-CM | POA: Diagnosis present

## 2016-03-10 DIAGNOSIS — I10 Essential (primary) hypertension: Secondary | ICD-10-CM | POA: Diagnosis not present

## 2016-03-10 DIAGNOSIS — Z9889 Other specified postprocedural states: Secondary | ICD-10-CM | POA: Insufficient documentation

## 2016-03-10 DIAGNOSIS — M1990 Unspecified osteoarthritis, unspecified site: Secondary | ICD-10-CM | POA: Insufficient documentation

## 2016-03-10 HISTORY — DX: Essential (primary) hypertension: I10

## 2016-03-10 HISTORY — DX: Dizziness and giddiness: R42

## 2016-03-10 HISTORY — DX: Unspecified osteoarthritis, unspecified site: M19.90

## 2016-03-10 HISTORY — PX: CATARACT EXTRACTION W/PHACO: SHX586

## 2016-03-10 HISTORY — DX: Presence of external hearing-aid: Z97.4

## 2016-03-10 HISTORY — DX: Other allergy status, other than to drugs and biological substances: Z91.09

## 2016-03-10 SURGERY — PHACOEMULSIFICATION, CATARACT, WITH IOL INSERTION
Anesthesia: Monitor Anesthesia Care | Laterality: Right | Wound class: Clean

## 2016-03-10 MED ORDER — MIDAZOLAM HCL 2 MG/2ML IJ SOLN
INTRAMUSCULAR | Status: DC | PRN
  Administered 2016-03-10: 1 mg via INTRAVENOUS

## 2016-03-10 MED ORDER — POVIDONE-IODINE 5 % OP SOLN
1.0000 "application " | OPHTHALMIC | Status: DC | PRN
  Administered 2016-03-10: 1 via OPHTHALMIC

## 2016-03-10 MED ORDER — NA HYALUR & NA CHOND-NA HYALUR 0.4-0.35 ML IO KIT
PACK | INTRAOCULAR | Status: DC | PRN
  Administered 2016-03-10: 1 mL via INTRAOCULAR

## 2016-03-10 MED ORDER — TETRACAINE HCL 0.5 % OP SOLN
1.0000 [drp] | OPHTHALMIC | Status: DC | PRN
  Administered 2016-03-10: 1 [drp] via OPHTHALMIC

## 2016-03-10 MED ORDER — BRIMONIDINE TARTRATE 0.2 % OP SOLN
OPHTHALMIC | Status: DC | PRN
  Administered 2016-03-10: 1 [drp] via OPHTHALMIC

## 2016-03-10 MED ORDER — LACTATED RINGERS IV SOLN: INTRAVENOUS | Status: DC

## 2016-03-10 MED ORDER — CEFUROXIME OPHTHALMIC INJECTION 1 MG/0.1 ML
INJECTION | OPHTHALMIC | Status: DC | PRN
  Administered 2016-03-10: 0.1 mL via OPHTHALMIC

## 2016-03-10 MED ORDER — ARMC OPHTHALMIC DILATING GEL
1.0000 "application " | OPHTHALMIC | Status: DC | PRN
  Administered 2016-03-10 (×2): 1 via OPHTHALMIC

## 2016-03-10 MED ORDER — TIMOLOL MALEATE 0.5 % OP SOLN
OPHTHALMIC | Status: DC | PRN
  Administered 2016-03-10: 1 [drp] via OPHTHALMIC

## 2016-03-10 MED ORDER — FENTANYL CITRATE (PF) 100 MCG/2ML IJ SOLN
INTRAMUSCULAR | Status: DC | PRN
  Administered 2016-03-10: 50 ug via INTRAVENOUS

## 2016-03-10 MED ORDER — LIDOCAINE HCL (PF) 4 % IJ SOLN
INTRAOCULAR | Status: DC | PRN
  Administered 2016-03-10: 1 mL via OPHTHALMIC

## 2016-03-10 SURGICAL SUPPLY — 21 items
CANNULA ANT/CHMB 27GA (MISCELLANEOUS) ×3 IMPLANT
CARTRIDGE ABBOTT (MISCELLANEOUS) IMPLANT
GLOVE SURG LX 7.5 STRW (GLOVE) ×2
GLOVE SURG LX STRL 7.5 STRW (GLOVE) ×1 IMPLANT
GLOVE SURG TRIUMPH 8.0 PF LTX (GLOVE) ×3 IMPLANT
GOWN STRL REUS W/ TWL LRG LVL3 (GOWN DISPOSABLE) ×2 IMPLANT
GOWN STRL REUS W/TWL LRG LVL3 (GOWN DISPOSABLE) ×4
LENS IOL TECNIS ITEC 20.5 (Intraocular Lens) ×3 IMPLANT
MARKER SKIN DUAL TIP RULER LAB (MISCELLANEOUS) ×3 IMPLANT
NDL RETROBULBAR .5 NSTRL (NEEDLE) IMPLANT
PACK CATARACT BRASINGTON (MISCELLANEOUS) ×3 IMPLANT
PACK EYE AFTER SURG (MISCELLANEOUS) ×3 IMPLANT
PACK OPTHALMIC (MISCELLANEOUS) ×3 IMPLANT
RING MALYGIN 7.0 (MISCELLANEOUS) IMPLANT
SUT ETHILON 10-0 CS-B-6CS-B-6 (SUTURE)
SUT VICRYL  9 0 (SUTURE)
SUT VICRYL 9 0 (SUTURE) IMPLANT
SUTURE EHLN 10-0 CS-B-6CS-B-6 (SUTURE) IMPLANT
SYR TB 1ML LUER SLIP (SYRINGE) ×3 IMPLANT
WATER STERILE IRR 250ML POUR (IV SOLUTION) ×3 IMPLANT
WIPE NON LINTING 3.25X3.25 (MISCELLANEOUS) ×3 IMPLANT

## 2016-03-10 NOTE — Anesthesia Preprocedure Evaluation (Signed)
Anesthesia Evaluation  Patient identified by MRN, date of birth, ID band Patient awake    Airway Mallampati: II  TM Distance: >3 FB Neck ROM: Full    Dental   Pulmonary asthma , former smoker,    Pulmonary exam normal        Cardiovascular hypertension, Normal cardiovascular exam     Neuro/Psych    GI/Hepatic   Endo/Other    Renal/GU      Musculoskeletal   Abdominal   Peds  Hematology   Anesthesia Other Findings   Reproductive/Obstetrics                             Anesthesia Physical Anesthesia Plan  ASA: II  Anesthesia Plan: MAC   Post-op Pain Management:    Induction: Intravenous  Airway Management Planned:   Additional Equipment:   Intra-op Plan:   Post-operative Plan:   Informed Consent: I have reviewed the patients History and Physical, chart, labs and discussed the procedure including the risks, benefits and alternatives for the proposed anesthesia with the patient or authorized representative who has indicated his/her understanding and acceptance.     Plan Discussed with: CRNA  Anesthesia Plan Comments:         Anesthesia Quick Evaluation

## 2016-03-10 NOTE — Transfer of Care (Signed)
Immediate Anesthesia Transfer of Care Note  Patient: Stephen Hawkins E Cafarelli  Procedure(s) Performed: Procedure(s) with comments: CATARACT EXTRACTION PHACO AND INTRAOCULAR LENS PLACEMENT (IOC) (Right) - LATEX allergy  Patient Location: PACU  Anesthesia Type: MAC  Level of Consciousness: awake, alert  and patient cooperative  Airway and Oxygen Therapy: Patient Spontanous Breathing and Patient connected to supplemental oxygen  Post-op Assessment: Post-op Vital signs reviewed, Patient's Cardiovascular Status Stable, Respiratory Function Stable, Patent Airway and No signs of Nausea or vomiting  Post-op Vital Signs: Reviewed and stable  Complications: No apparent anesthesia complications

## 2016-03-10 NOTE — Anesthesia Postprocedure Evaluation (Signed)
Anesthesia Post Note  Patient: Stephen Hawkins  Procedure(s) Performed: Procedure(s) (LRB): CATARACT EXTRACTION PHACO AND INTRAOCULAR LENS PLACEMENT (IOC) (Right)  Patient location during evaluation: PACU Anesthesia Type: General Level of consciousness: awake and alert Pain management: pain level controlled Vital Signs Assessment: post-procedure vital signs reviewed and stable Respiratory status: spontaneous breathing, nonlabored ventilation, respiratory function stable and patient connected to nasal cannula oxygen Cardiovascular status: blood pressure returned to baseline and stable Postop Assessment: no signs of nausea or vomiting Anesthetic complications: no    Dorene GrebeMcCulloch, Arwen Haseley V

## 2016-03-10 NOTE — H&P (Signed)
  The History and Physical notes are on paper, have been signed, and are to be scanned. The patient remains stable and unchanged from the H&P.   Previous H&P reviewed, patient examined, and there are no changes.  Stephen Hawkins 03/10/2016 8:12 AM

## 2016-03-10 NOTE — Anesthesia Procedure Notes (Signed)
Procedure Name: MAC Performed by: Lawerence Dery Pre-anesthesia Checklist: Patient identified, Emergency Drugs available, Suction available, Patient being monitored and Timeout performed Patient Re-evaluated:Patient Re-evaluated prior to inductionOxygen Delivery Method: Nasal cannula       

## 2016-03-10 NOTE — Op Note (Signed)
LOCATION:  [facility]   PREOPERATIVE DIAGNOSIS:    Nuclear sclerotic cataract right eye. H25.11   POSTOPERATIVE DIAGNOSIS:  Nuclear sclerotic cataract right eye.     PROCEDURE:  Phacoemusification with posterior chamber intraocular lens placement of the right eye   LENS:   Implant Name Type Inv. Item Serial No. Manufacturer Lot No. LRB No. Used  LENS IOL DIOP 20.5 - [id number] Intraocular Lens LENS IOL DIOP 20.5 [id number]   Right 1        ULTRASOUND TIME: 19 % of 1 minutes, 42 seconds.  CDE 19.9   SURGEON:  Deirdre Evenerhadwick R. Sigrid Schwebach, MD   ANESTHESIA:  Topical with tetracaine drops and 2% Xylocaine jelly, augmented with 1% preservative-free intracameral lidocaine.    COMPLICATIONS:  None.   DESCRIPTION OF PROCEDURE:  The patient was identified in the holding room and transported to the operating room and placed in the supine position under the operating microscope.  The right eye was identified as the operative eye and it was prepped and draped in the usual sterile ophthalmic fashion.   A 1 millimeter clear-corneal paracentesis was made at the 12:00 position.  0.5 ml of preservative-free 1% lidocaine was injected into the anterior chamber. The anterior chamber was filled with Viscoat viscoelastic.  A 2.4 millimeter keratome was used to make a near-clear corneal incision at the 9:00 position.  A curvilinear capsulorrhexis was made with a cystotome and capsulorrhexis forceps.  Balanced salt solution was used to hydrodissect and hydrodelineate the nucleus.   Phacoemulsification was then used in stop and chop fashion to remove the lens nucleus and epinucleus.  The remaining cortex was then removed using the irrigation and aspiration handpiece. Provisc was then placed into the capsular bag to distend it for lens placement.  A lens was then injected into the capsular bag.  The remaining viscoelastic was aspirated.   Wounds were hydrated with balanced salt solution.  The anterior  chamber was inflated to a physiologic pressure with balanced salt solution.  No wound leaks were noted. Cefuroxime 0.1 ml of a 10mg /ml solution was injected into the anterior chamber for a dose of 1 mg of intracameral antibiotic at the completion of the case.   Timolol and Brimonidine drops were applied to the eye.  The patient was taken to the recovery room in stable condition without complications of anesthesia or surgery.   Luken Shadowens 03/10/2016, 9:09 AM

## 2016-05-31 NOTE — Discharge Instructions (Signed)

## 2016-06-02 ENCOUNTER — Encounter: Payer: Self-pay | Admitting: *Deleted

## 2016-06-02 ENCOUNTER — Ambulatory Visit: Payer: BC Managed Care – PPO | Admitting: Anesthesiology

## 2016-06-02 ENCOUNTER — Encounter
Admission: RE | Disposition: A | Discharge status: Home / Self Care | Payer: Self-pay | Source: Ambulatory Visit | Attending: Ophthalmology

## 2016-06-02 ENCOUNTER — Ambulatory Visit
Admission: RE | Admit: 2016-06-02 | Discharge: 2016-06-02 | Disposition: A | Discharge status: Home / Self Care | Payer: BC Managed Care – PPO | Source: Ambulatory Visit | Attending: Ophthalmology | Admitting: Ophthalmology

## 2016-06-02 DIAGNOSIS — I10 Essential (primary) hypertension: Secondary | ICD-10-CM | POA: Diagnosis not present

## 2016-06-02 DIAGNOSIS — Z7951 Long term (current) use of inhaled steroids: Secondary | ICD-10-CM | POA: Insufficient documentation

## 2016-06-02 DIAGNOSIS — Z87891 Personal history of nicotine dependence: Secondary | ICD-10-CM | POA: Insufficient documentation

## 2016-06-02 DIAGNOSIS — Z79899 Other long term (current) drug therapy: Secondary | ICD-10-CM | POA: Insufficient documentation

## 2016-06-02 DIAGNOSIS — Z8701 Personal history of pneumonia (recurrent): Secondary | ICD-10-CM | POA: Diagnosis not present

## 2016-06-02 DIAGNOSIS — Z9889 Other specified postprocedural states: Secondary | ICD-10-CM | POA: Insufficient documentation

## 2016-06-02 DIAGNOSIS — H2512 Age-related nuclear cataract, left eye: Secondary | ICD-10-CM | POA: Diagnosis not present

## 2016-06-02 DIAGNOSIS — J45909 Unspecified asthma, uncomplicated: Secondary | ICD-10-CM | POA: Insufficient documentation

## 2016-06-02 HISTORY — DX: Presence of dental prosthetic device (complete) (partial): Z97.2

## 2016-06-02 HISTORY — DX: Pneumonia, unspecified organism: J18.9

## 2016-06-02 HISTORY — DX: Gastro-esophageal reflux disease without esophagitis: K21.9

## 2016-06-02 HISTORY — PX: CATARACT EXTRACTION W/PHACO: SHX586

## 2016-06-02 HISTORY — DX: Reserved for inherently not codable concepts without codable children: IMO0001

## 2016-06-02 SURGERY — PHACOEMULSIFICATION, CATARACT, WITH IOL INSERTION
Anesthesia: Monitor Anesthesia Care | Laterality: Left | Wound class: Clean

## 2016-06-02 MED ORDER — CEFUROXIME OPHTHALMIC INJECTION 1 MG/0.1 ML
INJECTION | OPHTHALMIC | Status: DC | PRN
  Administered 2016-06-02: 0.1 mL via OPHTHALMIC

## 2016-06-02 MED ORDER — ARMC OPHTHALMIC DILATING GEL
1.0000 "application " | OPHTHALMIC | Status: DC | PRN
  Administered 2016-06-02 (×2): 1 via OPHTHALMIC

## 2016-06-02 MED ORDER — NA HYALUR & NA CHOND-NA HYALUR 0.4-0.35 ML IO KIT
PACK | INTRAOCULAR | Status: DC | PRN
  Administered 2016-06-02: 1 mL via INTRAOCULAR

## 2016-06-02 MED ORDER — TIMOLOL MALEATE 0.5 % OP SOLN
OPHTHALMIC | Status: DC | PRN
  Administered 2016-06-02: 1 [drp] via OPHTHALMIC

## 2016-06-02 MED ORDER — ACETAMINOPHEN 325 MG PO TABS: 325.0000 mg | ORAL_TABLET | ORAL | Status: DC | PRN

## 2016-06-02 MED ORDER — FENTANYL CITRATE (PF) 100 MCG/2ML IJ SOLN
INTRAMUSCULAR | Status: DC | PRN
  Administered 2016-06-02: 25 ug via INTRAVENOUS
  Administered 2016-06-02: 50 ug via INTRAVENOUS

## 2016-06-02 MED ORDER — LIDOCAINE HCL (PF) 4 % IJ SOLN
INTRAOCULAR | Status: DC | PRN
  Administered 2016-06-02: 1 mL via OPHTHALMIC

## 2016-06-02 MED ORDER — ACETAMINOPHEN 160 MG/5ML PO SOLN: 325.0000 mg | ORAL | Status: DC | PRN

## 2016-06-02 MED ORDER — MIDAZOLAM HCL 2 MG/2ML IJ SOLN
INTRAMUSCULAR | Status: DC | PRN
  Administered 2016-06-02: 1 mg via INTRAVENOUS

## 2016-06-02 MED ORDER — BRIMONIDINE TARTRATE 0.2 % OP SOLN
OPHTHALMIC | Status: DC | PRN
  Administered 2016-06-02: 1 [drp] via OPHTHALMIC

## 2016-06-02 MED ORDER — POVIDONE-IODINE 5 % OP SOLN
1.0000 "application " | OPHTHALMIC | Status: DC | PRN
  Administered 2016-06-02: 1 via OPHTHALMIC

## 2016-06-02 MED ORDER — BSS IO SOLN
INTRAOCULAR | Status: DC | PRN
  Administered 2016-06-02: 84 mL via OPHTHALMIC

## 2016-06-02 MED ORDER — TETRACAINE HCL 0.5 % OP SOLN
1.0000 [drp] | OPHTHALMIC | Status: DC | PRN
  Administered 2016-06-02: 1 [drp] via OPHTHALMIC

## 2016-06-02 SURGICAL SUPPLY — 25 items
CANNULA ANT/CHMB 27GA (MISCELLANEOUS) ×3 IMPLANT
CARTRIDGE ABBOTT (MISCELLANEOUS) IMPLANT
GLOVE SURG LX 7.5 STRW (GLOVE) ×2
GLOVE SURG LX STRL 7.5 STRW (GLOVE) ×1 IMPLANT
GLOVE SURG TRIUMPH 8.0 PF LTX (GLOVE) ×3 IMPLANT
GOWN STRL REUS W/ TWL LRG LVL3 (GOWN DISPOSABLE) ×2 IMPLANT
GOWN STRL REUS W/TWL LRG LVL3 (GOWN DISPOSABLE) ×4
LENS IOL TECNIS ITEC 20.5 (Intraocular Lens) ×3 IMPLANT
MARKER SKIN DUAL TIP RULER LAB (MISCELLANEOUS) ×3 IMPLANT
NDL RETROBULBAR .5 NSTRL (NEEDLE) IMPLANT
NEEDLE FILTER BLUNT 18X 1/2SAF (NEEDLE) ×2
NEEDLE FILTER BLUNT 18X1 1/2 (NEEDLE) ×1 IMPLANT
PACK CATARACT BRASINGTON (MISCELLANEOUS) ×3 IMPLANT
PACK EYE AFTER SURG (MISCELLANEOUS) ×3 IMPLANT
PACK OPTHALMIC (MISCELLANEOUS) ×3 IMPLANT
RING MALYGIN 7.0 (MISCELLANEOUS) IMPLANT
SUT ETHILON 10-0 CS-B-6CS-B-6 (SUTURE)
SUT VICRYL  9 0 (SUTURE)
SUT VICRYL 9 0 (SUTURE) IMPLANT
SUTURE EHLN 10-0 CS-B-6CS-B-6 (SUTURE) IMPLANT
SYR 3ML LL SCALE MARK (SYRINGE) ×3 IMPLANT
SYR 5ML LL (SYRINGE) ×3 IMPLANT
SYR TB 1ML LUER SLIP (SYRINGE) ×3 IMPLANT
WATER STERILE IRR 250ML POUR (IV SOLUTION) ×3 IMPLANT
WIPE NON LINTING 3.25X3.25 (MISCELLANEOUS) ×3 IMPLANT

## 2016-06-02 NOTE — Anesthesia Postprocedure Evaluation (Signed)
Anesthesia Post Note  Patient: Stephen Hawkins  Procedure(s) Performed: Procedure(s) (LRB): CATARACT EXTRACTION PHACO AND INTRAOCULAR LENS PLACEMENT (IOC) (Left)  Patient location during evaluation: PACU Anesthesia Type: MAC Level of consciousness: awake and alert and oriented Pain management: satisfactory to patient Vital Signs Assessment: post-procedure vital signs reviewed and stable Respiratory status: spontaneous breathing, nonlabored ventilation and respiratory function stable Cardiovascular status: blood pressure returned to baseline and stable Postop Assessment: Adequate PO intake and No signs of nausea or vomiting Anesthetic complications: no    Cherly BeachStella, Sneijder Bernards J

## 2016-06-02 NOTE — Anesthesia Procedure Notes (Addendum)
Procedure Name: MAC Date/Time: 06/02/2016 9:18 AM Performed by: Maryan RuedWILSON, Sharnae Winfree M Pre-anesthesia Checklist: Patient identified, Emergency Drugs available, Suction available, Patient being monitored and Timeout performed Patient Re-evaluated:Patient Re-evaluated prior to inductionOxygen Delivery Method: Nasal cannula

## 2016-06-02 NOTE — Anesthesia Preprocedure Evaluation (Signed)
Anesthesia Evaluation  Patient identified by MRN, date of birth, ID band Patient awake    Reviewed: Allergy & Precautions, H&P , NPO status , Patient's Chart, lab work & pertinent test results  Airway Mallampati: II  TM Distance: >3 FB Neck ROM: full    Dental no notable dental hx.    Pulmonary shortness of breath and with exertion, asthma , former smoker,    Pulmonary exam normal        Cardiovascular hypertension, Normal cardiovascular exam     Neuro/Psych    GI/Hepatic GERD  ,  Endo/Other    Renal/GU      Musculoskeletal   Abdominal   Peds  Hematology   Anesthesia Other Findings   Reproductive/Obstetrics                             Anesthesia Physical  Anesthesia Plan  ASA: II  Anesthesia Plan: MAC   Post-op Pain Management:    Induction: Intravenous  Airway Management Planned:   Additional Equipment:   Intra-op Plan:   Post-operative Plan:   Informed Consent: I have reviewed the patients History and Physical, chart, labs and discussed the procedure including the risks, benefits and alternatives for the proposed anesthesia with the patient or authorized representative who has indicated his/her understanding and acceptance.     Plan Discussed with: CRNA  Anesthesia Plan Comments:         Anesthesia Quick Evaluation

## 2016-06-02 NOTE — H&P (Signed)
  The History and Physical notes are on paper, have been signed, and are to be scanned. The patient remains stable and unchanged from the H&P.   Previous H&P reviewed, patient examined, and there are no changes.  Vanna Shavers 06/02/2016 8:49 AM

## 2016-06-02 NOTE — Transfer of Care (Signed)
Immediate Anesthesia Transfer of Care Note  Patient: Stephen Hawkins  Procedure(s) Performed: Procedure(s): CATARACT EXTRACTION PHACO AND INTRAOCULAR LENS PLACEMENT (IOC) (Left)  Patient Location: PACU  Anesthesia Type: MAC  Level of Consciousness: awake, alert  and patient cooperative  Airway and Oxygen Therapy: Patient Spontanous Breathing and Patient connected to supplemental oxygen  Post-op Assessment: Post-op Vital signs reviewed, Patient's Cardiovascular Status Stable, Respiratory Function Stable, Patent Airway and No signs of Nausea or vomiting  Post-op Vital Signs: Reviewed and stable  Complications: No apparent anesthesia complications

## 2016-06-02 NOTE — Op Note (Signed)
OPERATIVE NOTE  Stephen Hawkins [id number] 06/02/2016   PREOPERATIVE DIAGNOSIS:  Nuclear sclerotic cataract left eye. H25.12   POSTOPERATIVE DIAGNOSIS:    Nuclear sclerotic cataract left eye.     PROCEDURE:  Phacoemusification with posterior chamber intraocular lens placement of the left eye   LENS:   Implant Name Type Inv. Item Serial No. Manufacturer Lot No. LRB No. Used  LENS IOL DIOP 20.5 - [id number] Intraocular Lens LENS IOL DIOP 20.5 [id number]   Left 1        ULTRASOUND TIME: 14  % of 1 minutes 41 seconds, CDE 14.6  SURGEON:  Deirdre Evenerhadwick R. Aaniya Sterba, MD   ANESTHESIA:  Topical with tetracaine drops and 2% Xylocaine jelly, augmented with 1% preservative-free intracameral lidocaine.    COMPLICATIONS:  None.   DESCRIPTION OF PROCEDURE:  The patient was identified in the holding room and transported to the operating room and placed in the supine position under the operating microscope.  The left eye was identified as the operative eye and it was prepped and draped in the usual sterile ophthalmic fashion.   A 1 millimeter clear-corneal paracentesis was made at the 1:30 position.  0.5 ml of preservative-free 1% lidocaine was injected into the anterior chamber.  The anterior chamber was filled with Viscoat viscoelastic.  A 2.4 millimeter keratome was used to make a near-clear corneal incision at the 10:30 position.  .  A curvilinear capsulorrhexis was made with a cystotome and capsulorrhexis forceps.  Balanced salt solution was used to hydrodissect and hydrodelineate the nucleus.   Phacoemulsification was then used in stop and chop fashion to remove the lens nucleus and epinucleus.  The remaining cortex was then removed using the irrigation and aspiration handpiece. Provisc was then placed into the capsular bag to distend it for lens placement.  A lens was then injected into the capsular bag.  The remaining viscoelastic was aspirated.   Wounds were hydrated with balanced salt  solution.  The anterior chamber was inflated to a physiologic pressure with balanced salt solution.  No wound leaks were noted. Cefuroxime 0.1 ml of a 10mg /ml solution was injected into the anterior chamber for a dose of 1 mg of intracameral antibiotic at the completion of the case.   Timolol and Brimonidine drops were applied to the eye.  The patient was taken to the recovery room in stable condition without complications of anesthesia or surgery.  Stephen Hawkins 06/02/2016, 9:37 AM

## 2016-06-03 ENCOUNTER — Encounter: Payer: Self-pay | Admitting: Ophthalmology

## 2016-10-13 ENCOUNTER — Ambulatory Visit (HOSPITAL_COMMUNITY)
Admission: EM | Admit: 2016-10-13 | Discharge: 2016-10-14 | Disposition: A | Discharge status: Home / Self Care | Payer: BC Managed Care – PPO | Attending: Emergency Medicine | Admitting: Emergency Medicine

## 2016-10-13 ENCOUNTER — Encounter (HOSPITAL_COMMUNITY): Payer: Self-pay | Admitting: Emergency Medicine

## 2016-10-13 DIAGNOSIS — T18128A Food in esophagus causing other injury, initial encounter: Secondary | ICD-10-CM | POA: Insufficient documentation

## 2016-10-13 DIAGNOSIS — M19042 Primary osteoarthritis, left hand: Secondary | ICD-10-CM | POA: Insufficient documentation

## 2016-10-13 DIAGNOSIS — Z91040 Latex allergy status: Secondary | ICD-10-CM | POA: Insufficient documentation

## 2016-10-13 DIAGNOSIS — B3781 Candidal esophagitis: Secondary | ICD-10-CM | POA: Diagnosis not present

## 2016-10-13 DIAGNOSIS — Z9841 Cataract extraction status, right eye: Secondary | ICD-10-CM | POA: Diagnosis not present

## 2016-10-13 DIAGNOSIS — K222 Esophageal obstruction: Secondary | ICD-10-CM | POA: Insufficient documentation

## 2016-10-13 DIAGNOSIS — Z87891 Personal history of nicotine dependence: Secondary | ICD-10-CM | POA: Diagnosis not present

## 2016-10-13 DIAGNOSIS — M170 Bilateral primary osteoarthritis of knee: Secondary | ICD-10-CM | POA: Insufficient documentation

## 2016-10-13 DIAGNOSIS — E876 Hypokalemia: Secondary | ICD-10-CM | POA: Diagnosis not present

## 2016-10-13 DIAGNOSIS — Z9842 Cataract extraction status, left eye: Secondary | ICD-10-CM | POA: Diagnosis not present

## 2016-10-13 DIAGNOSIS — Z9102 Food additives allergy status: Secondary | ICD-10-CM | POA: Diagnosis not present

## 2016-10-13 DIAGNOSIS — M19071 Primary osteoarthritis, right ankle and foot: Secondary | ICD-10-CM | POA: Insufficient documentation

## 2016-10-13 DIAGNOSIS — K219 Gastro-esophageal reflux disease without esophagitis: Secondary | ICD-10-CM | POA: Diagnosis not present

## 2016-10-13 DIAGNOSIS — K449 Diaphragmatic hernia without obstruction or gangrene: Secondary | ICD-10-CM | POA: Diagnosis not present

## 2016-10-13 DIAGNOSIS — Z91013 Allergy to seafood: Secondary | ICD-10-CM | POA: Diagnosis not present

## 2016-10-13 DIAGNOSIS — I10 Essential (primary) hypertension: Secondary | ICD-10-CM | POA: Insufficient documentation

## 2016-10-13 DIAGNOSIS — X58XXXA Exposure to other specified factors, initial encounter: Secondary | ICD-10-CM | POA: Diagnosis not present

## 2016-10-13 DIAGNOSIS — J45909 Unspecified asthma, uncomplicated: Secondary | ICD-10-CM | POA: Diagnosis not present

## 2016-10-13 MED ORDER — GLUCAGON HCL RDNA (DIAGNOSTIC) 1 MG IJ SOLR: 1.0000 mg | Freq: Once | INTRAMUSCULAR | Status: DC

## 2016-10-13 NOTE — ED Provider Notes (Signed)
[facility] Provider Note   CSN: [id number] Arrival date & time: 10/13/16  2145   By signing my name below, I, Nelwyn SalisburyJoshua Fowler, attest that this documentation has been prepared under the direction and in the presence of Zadie Rhineonald Sajan Cheatwood, MD . Electronically Signed: Nelwyn SalisburyJoshua Fowler, Scribe. 10/13/2016. 11:05 PM.  History   Chief Complaint Chief Complaint  Patient presents with  . Foreign Body   The history is provided by the patient. No language interpreter was used.  Foreign Body  The current episode started 1 to 2 hours ago. The foreign body is suspected to be swallowed. The foreign body is food. The incident was reported. The incident was witnessed/reported by the patient. Associated symptoms include trouble swallowing. Pertinent negatives include no fever, no difficulty breathing, no chest pain and no abdominal pain.     HPI Comments:  Stephen Hawkins is a 83 y.o. male with pmhx of HTN and GERD who presents to the Emergency Department complaining of sudden-onset constant unchanged sensation of foreign body beginning two hours ago. Pt states he was eating an orange without his lower dentures in when he swallowed a wedge and felt it get stuck in his throat. He reports a sensation of indigestion and has been unable to swallow anything. Pt denies any fever, CP, abdominal pain or SOB  Past Medical History:  Diagnosis Date  . Arthritis    knees, right foot, left hand  . Asthma   . Environmental allergies   . GERD (gastroesophageal reflux disease)   . Hypertension    no medicines.  MD watching.  . Pneumonia   . Shortness of breath dyspnea    with exertion  . Vertigo    distant past  . Wears dentures    upper full plate  . Wears hearing aid    bilateral    Patient Active Problem List   Diagnosis Date Noted  . GERD (gastroesophageal reflux disease) 06/28/2012  . Left shoulder pain 06/28/2012  . Chest pain 06/27/2012  . Asthma 06/27/2012  . Hypertension 06/27/2012  .  Bradycardia 06/27/2012  . Hypokalemia 06/27/2012    Past Surgical History:  Procedure Laterality Date  . CATARACT EXTRACTION W/PHACO Right 03/10/2016   Procedure: CATARACT EXTRACTION PHACO AND INTRAOCULAR LENS PLACEMENT (IOC);  Surgeon: Lockie Molahadwick Brasington, MD;  [facility];  Service: Ophthalmology;  Laterality: Right;  LATEX allergy  . CATARACT EXTRACTION W/PHACO Left 06/02/2016   Procedure: CATARACT EXTRACTION PHACO AND INTRAOCULAR LENS PLACEMENT (IOC);  Surgeon: Lockie Molahadwick Brasington, MD;  [facility];  Service: Ophthalmology;  Laterality: Left;  . CYSTOSCOPY    . HERNIA REPAIR    . 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 Authorizing Provider  albuterol (PROVENTIL HFA;VENTOLIN HFA) 108 (90 BASE) MCG/ACT inhaler Inhale 2 puffs into the lungs every 6 (six) hours as needed for wheezing or shortness of breath.     Historical Provider, MD  budesonide-formoterol (SYMBICORT) 80-4.5 MCG/ACT inhaler Inhale 2 puffs into the lungs 2 (two) times daily.    Historical Provider, MD  diphenhydrAMINE (BENADRYL) 25 MG tablet Take 25 mg by mouth every 6 (six) hours as needed.    Historical Provider, MD  montelukast (SINGULAIR) 10 MG tablet Take 10 mg by mouth at bedtime.    Historical Provider, MD  naproxen (NAPROSYN) 500 MG tablet Take 1 tablet (500  mg total) by mouth 2 (two) times daily. Patient taking differently: Take 500 mg by mouth 2 (two) times daily.  08/10/15   Tatyana Kirichenko, PA-C  triamcinolone cream (KENALOG) 0.1 % Apply 1 application topically 2 (two) times daily.    Historical Provider, MD    Family History No family history on file.  Social History Social History  Substance Use Topics  . Smoking status: Former Games developer  . Smokeless tobacco: Never Used     Comment: quit over 40 yrs ago  . Alcohol use No     Allergies   Celery oil;  Fish allergy; and Latex   Review of Systems Review of Systems  Constitutional: Negative for fever.  HENT: Positive for trouble swallowing.        Positive for Sensation of Foreign Body  Respiratory: Negative for shortness of breath.   Cardiovascular: Negative for chest pain.  Gastrointestinal: Negative for abdominal pain.  All other systems reviewed and are negative.    Physical Exam Updated Vital Signs BP 127/71 (BP Location: Left Arm)   Pulse 64   Temp 98 F (36.7 C) (Oral)   Resp 20   Ht 6' (1.829 m)   Wt 240 lb (108.9 kg)   SpO2 97%   BMI 32.55 kg/m   Physical Exam CONSTITUTIONAL: Well developed/well nourished HEAD: Normocephalic/atraumatic EYES: EOMI/PERRL ENMT: Mucous membranes moist. No stridor, patient spitting frequently during exam but No drooling noted.   NECK: supple no meningeal signs SPINE/BACK:entire spine nontender CV: S1/S2 noted, no murmurs/rubs/gallops noted LUNGS: Lungs are clear to auscultation bilaterally, no apparent distress ABDOMEN: soft, nontender, no rebound or guarding, bowel sounds noted throughout abdomen GU:no cva tenderness NEURO: Pt is awake/alert/appropriate, moves all extremitiesx4.  EXTREMITIES: pulses normal/equal, full ROM SKIN: warm, color normal PSYCH: no abnormalities of mood noted, alert and oriented to situation   ED Treatments / Results  DIAGNOSTIC STUDIES:  Oxygen Saturation is 97% on RA, normal by my interpretation.    COORDINATION OF CARE:  11:13 PM Discussed treatment plan with pt at bedside which includes Glucagon and pt agreed to plan.  11:28 PM Consulted with Dr. Elnoria Howard in GI about plan for patient.   11:33 PM Updated patient Dr hung to see patient, likely will require EGD  Labs (all labs ordered are listed, but only abnormal results are displayed) Labs Reviewed - No data to display  EKG  EKG Interpretation None       Radiology No results found.  Procedures Procedures (including critical care  time)  Medications Ordered in ED Medications  glucagon (human recombinant) (GLUCAGEN) injection 1 mg (not administered)     Initial Impression / Assessment and Plan / ED Course  I have reviewed the triage vital signs and the nursing notes.   Clinical Course       Final Clinical Impressions(s) / ED Diagnoses   Final diagnoses:  Food impaction of esophagus, initial encounter    New Prescriptions New Prescriptions   No medications on file  I personally performed the services described in this documentation, which was scribed in my presence. The recorded information has been reviewed and is accurate.        Zadie Rhine, MD 10/14/16 0010

## 2016-10-13 NOTE — ED Triage Notes (Signed)
Pt states "i swallowed an orange wedge and its stuck in my throat." Pt breathing easily, no adventitious breath sounds. Pt states "i cant swallow my spit or I start choking.". Pt in nad.

## 2016-10-14 ENCOUNTER — Encounter (HOSPITAL_COMMUNITY): Payer: Self-pay

## 2016-10-14 ENCOUNTER — Encounter (HOSPITAL_COMMUNITY)
Admission: EM | Disposition: A | Discharge status: Home / Self Care | Payer: Self-pay | Source: Home / Self Care | Attending: Emergency Medicine

## 2016-10-14 DIAGNOSIS — T18128A Food in esophagus causing other injury, initial encounter: Secondary | ICD-10-CM | POA: Diagnosis not present

## 2016-10-14 HISTORY — PX: ESOPHAGOGASTRODUODENOSCOPY: SHX5428

## 2016-10-14 SURGERY — EGD (ESOPHAGOGASTRODUODENOSCOPY)
Anesthesia: Moderate Sedation

## 2016-10-14 MED ORDER — MIDAZOLAM HCL 10 MG/2ML IJ SOLN
INTRAMUSCULAR | Status: DC | PRN
  Administered 2016-10-14 (×2): 2 mg via INTRAVENOUS

## 2016-10-14 MED ORDER — FENTANYL CITRATE (PF) 100 MCG/2ML IJ SOLN
INTRAMUSCULAR | Status: DC | PRN
  Administered 2016-10-14: 25 ug via INTRAVENOUS

## 2016-10-14 MED ORDER — FENTANYL CITRATE (PF) 100 MCG/2ML IJ SOLN
INTRAMUSCULAR | Status: AC
  Filled 2016-10-14: qty 2

## 2016-10-14 MED ORDER — MIDAZOLAM HCL 5 MG/ML IJ SOLN
INTRAMUSCULAR | Status: AC
  Filled 2016-10-14: qty 2

## 2016-10-14 NOTE — ED Notes (Signed)
GI and Endo at bedside

## 2016-10-14 NOTE — Op Note (Signed)
[facility] Patient Name: Stephen Hawkins Procedure Date : 10/14/2016 MRN: [id number] Attending MD: Jeani Hawking , MD Date of Birth: 01/13/1931 CSN: [id number] Age: 83 Admit Type: Outpatient Procedure:                Upper GI endoscopy Indications:              Dysphagia Providers:                Jeani Hawking, MD, Omelia Blackwater RN, RN, Beryle Beams, Technician Referring MD:              Medicines:                Midazolam 4 mg IV, Fentanyl 25 micrograms IV Complications:            No immediate complications. Estimated Blood Loss:     Estimated blood loss: none. Procedure:                Pre-Anesthesia Assessment:                           - Prior to the procedure, a History and Physical                            was performed, and patient medications and                            allergies were reviewed. The patient's tolerance of                            previous anesthesia was also reviewed. The risks                            and benefits of the procedure and the sedation                            options and risks were discussed with the patient.                            All questions were answered, and informed consent                            was obtained. Prior Anticoagulants: The patient has                            taken no previous anticoagulant or antiplatelet                            agents. ASA Grade Assessment: II - A patient with                            mild systemic disease. After reviewing the risks  and benefits, the patient was deemed in                            satisfactory condition to undergo the procedure.                           - Sedation was administered by an endoscopy nurse.                            The sedation level attained was moderate.                           After obtaining informed consent, the endoscope was                            passed under direct  vision. Throughout the                            procedure, the patient's blood pressure, pulse, and                            oxygen saturations were monitored continuously. The                            [id number] ([id number]) scope was introduced through the                            mouth, and advanced to the second part of duodenum.                            The upper GI endoscopy was accomplished without                            difficulty. The patient tolerated the procedure                            well. Scope In: Scope Out: Findings:      Food was found in the middle third of the esophagus. Removal of food was       accomplished.      One moderate benign-appearing, intrinsic stenosis was found. This       measured 1 cm (inner diameter) x less than one cm (in length) and was       traversed.      Localized candidiasis was found in the middle third of the esophagus.      A 2 cm hiatal hernia was present.      The stomach was normal.      The examined duodenum was normal.      The ingested orange was removed with Talon graspers and then the rest of       the bolus was able to be pushed into the gastric lumen. The mid       esohpagus exhibited a candidal esophagitis and there was spasm in this       area. This was the exact finding as the EGD on 01/2009. The distal  esophagus did have a stricture and it will require some dilation in the       near future. Impression:               - Food in the middle third of the esophagus.                            Removal was successful.                           - Benign-appearing esophageal stenosis.                           - Monilial esophagitis.                           - 2 cm hiatal hernia.                           - Normal stomach.                           - Normal examined duodenum. Moderate Sedation:      Moderate (conscious) sedation was administered by the endoscopy nurse       and supervised by the endoscopist. The  following parameters were       monitored: oxygen saturation, heart rate, blood pressure, and response       to care. Recommendation:           - Patient has a contact number available for                            emergencies. The signs and symptoms of potential                            delayed complications were discussed with the                            patient. Return to normal activities tomorrow.                            Written discharge instructions were provided to the                            patient.                           - Resume previous diet.                           - Continue present medications.                           - Return to GI clinic in 2 weeks. Procedure Code(s):        --- Professional ---                           [phone], Esophagogastroduodenoscopy, flexible,  transoral; with removal of foreign body(s) Diagnosis Code(s):        --- Professional ---                           Z61.096E, Food in esophagus causing other injury,                            initial encounter                           K22.2, Esophageal obstruction                           B37.81, Candidal esophagitis                           K44.9, Diaphragmatic hernia without obstruction or                            gangrene                           R13.10, Dysphagia, unspecified CPT copyright 2016 American Medical Association. All rights reserved. The codes documented in this report are preliminary and upon coder review may  be revised to meet current compliance requirements. Jeani Hawking, MD Jeani Hawking, MD 10/14/2016 2:25:56 PM This report has been signed electronically. Number of Addenda: 0

## 2016-10-14 NOTE — ED Notes (Signed)
Tacey RuizLeah [phone]

## 2016-10-14 NOTE — ED Provider Notes (Signed)
Pt had EGD by dr hung No complications Pt improved Taking PO fluids Will d/c home per dr hung instructions He is awake/alert at this time    Zadie Rhineonald Fiore Detjen, MD 10/14/16 [phone]

## 2016-10-14 NOTE — Consult Note (Signed)
Reason for Consult: Food impaction Referring Physician: ER   Stephen Hawkins HPI: [name] with a PMH of food impaction, HTN, GERD, arthritis, and asthma with another episode of a food impaction.  The last time he had this even was on 01/2009 and I performed the emergent disimpaction.  He was eating a piece of rib at that time and he was not wearing his dentures.  The actual EGD report is not available in the system, however, he denies any issues with dysphagia until this time.  Three hours ago he was eating a piece of orange without his dentures.  This resulted in an acute food impaction.  He is not able to manage his oral secretions.  Past Medical History:  Diagnosis Date  . Arthritis    knees, right foot, left hand  . Asthma   . Environmental allergies   . GERD (gastroesophageal reflux disease)   . Hypertension    no medicines.  MD watching.  . Pneumonia   . Shortness of breath dyspnea    with exertion  . Vertigo    distant past  . Wears dentures    upper full plate  . Wears hearing aid    bilateral    Past Surgical History:  Procedure Laterality Date  . CATARACT EXTRACTION W/PHACO Right 03/10/2016   Procedure: CATARACT EXTRACTION PHACO AND INTRAOCULAR LENS PLACEMENT (IOC);  Surgeon: Lockie Molahadwick Brasington, MD;  [facility];  Service: Ophthalmology;  Laterality: Right;  LATEX allergy  . CATARACT EXTRACTION W/PHACO Left 06/02/2016   Procedure: CATARACT EXTRACTION PHACO AND INTRAOCULAR LENS PLACEMENT (IOC);  Surgeon: Lockie Molahadwick Brasington, MD;  [facility];  Service: Ophthalmology;  Laterality: Left;  . CYSTOSCOPY    . HERNIA REPAIR    . 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  Social History:  reports that he has quit smoking. He has never used smokeless tobacco. He reports that he does not drink alcohol or use drugs.  Allergies:  Allergies  Allergen Reactions  . Celery Oil Swelling   whelps  . Fish Allergy     Asthma exaccerbation  . Latex Rash    Medications: Scheduled: Continuous:  No results found for this or any previous visit (from the past 24 hour(s)).   No results found.  ROS:  As stated above in the HPI otherwise negative.  Blood pressure 151/70, pulse (!) 42, temperature 98 F (36.7 C), temperature source Oral, resp. rate 20, height 6' (1.829 m), weight 108.9 kg (240 lb), SpO2 95 %.    PE: Gen: NAD, Alert and Oriented HEENT:  [place]/AT, EOMI Neck: Supple, no LAD Lungs: CTA Bilaterally CV: RRR without M/G/R ABM: Soft, NTND, +BS Ext: No C/C/E  Assessment/Plan: 1) Food impaction. 2) Dysphagia. 3) GERD.   The patient is stable.  [facility] and he did not follow up in the office.  I will repeat the EGD and he will require follow up in the office for dilation.  Plan: 1) EGD now.  Miyu Fenderson D 10/14/2016, 12:23 AM

## 2016-10-18 ENCOUNTER — Encounter (HOSPITAL_COMMUNITY): Payer: Self-pay | Admitting: Gastroenterology

## 2017-10-13 ENCOUNTER — Ambulatory Visit (HOSPITAL_COMMUNITY)
Admission: EM | Admit: 2017-10-13 | Discharge: 2017-10-13 | Disposition: A | Discharge status: Home / Self Care | Payer: Medicare Other | Attending: Family Medicine | Admitting: Family Medicine

## 2017-10-13 ENCOUNTER — Encounter (HOSPITAL_COMMUNITY): Payer: Self-pay | Admitting: Emergency Medicine

## 2017-10-13 DIAGNOSIS — L309 Dermatitis, unspecified: Secondary | ICD-10-CM | POA: Diagnosis not present

## 2017-10-13 MED ORDER — PREDNISONE 10 MG (21) PO TBPK: ORAL_TABLET | Freq: Every day | ORAL | 0 refills | Status: DC

## 2017-10-13 NOTE — ED Triage Notes (Signed)
PT has itchy generalized rash that has been present for 1 week. PT has had this before and reports he usually takes antibiotics for it.

## 2017-10-13 NOTE — ED Provider Notes (Signed)
[facility]    CSN: [id number] Arrival date & time: 10/13/17  1659     History   Chief Complaint Chief Complaint  Patient presents with  . Rash    HPI Stephen Hawkins is a [age] y.o. male.   84 year old male comes in for a 1 week history of itchy generalized rash.  States he has had similar symptoms before that resolved with prednisone.  He denies any new  exposures, medications, hygiene products.  Denies swelling of the throat, shortness of breath, trouble breathing.  He denies pain, fever.  States that rash is itching in nature, and spreading.  He has been taking but Benadryl without relief.  States that the symptoms have been more often during winter, does not apply lotion.  Denies history of DM, last seen PCP 2 months ago stating normal blood work.       Past Medical History:  Diagnosis Date  . Arthritis    knees, right foot, left hand  . Asthma   . Environmental allergies   . GERD (gastroesophageal reflux disease)   . Hypertension    no medicines.  MD watching.  . Pneumonia   . Shortness of breath dyspnea    with exertion  . Vertigo    distant past  . Wears dentures    upper full plate  . Wears hearing aid    bilateral    Patient Active Problem List   Diagnosis Date Noted  . GERD (gastroesophageal reflux disease) 06/28/2012  . Left shoulder pain 06/28/2012  . Chest pain 06/27/2012  . Asthma 06/27/2012  . Hypertension 06/27/2012  . Bradycardia 06/27/2012  . Hypokalemia 06/27/2012    Past Surgical History:  Procedure Laterality Date  . CATARACT EXTRACTION W/PHACO Right 03/10/2016   Procedure: CATARACT EXTRACTION PHACO AND INTRAOCULAR LENS PLACEMENT (IOC);  Surgeon: Lockie Molahadwick Brasington, MD;  [facility];  Service: Ophthalmology;  Laterality: Right;  LATEX allergy  . CATARACT EXTRACTION W/PHACO Left 06/02/2016   Procedure: CATARACT EXTRACTION PHACO AND INTRAOCULAR LENS PLACEMENT (IOC);  Surgeon: Lockie Molahadwick Brasington, MD;   [facility];  Service: Ophthalmology;  Laterality: Left;  . CYSTOSCOPY    . ESOPHAGOGASTRODUODENOSCOPY N/A 10/14/2016   Procedure: ESOPHAGOGASTRODUODENOSCOPY (EGD);  Surgeon: Jeani HawkingPatrick Hung, MD;  [facility];  Service: Endoscopy;  Laterality: N/A;  . HERNIA REPAIR    . 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 Authorizing Provider  albuterol (PROVENTIL HFA;VENTOLIN HFA) 108 (90 BASE) MCG/ACT inhaler Inhale 2 puffs into the lungs every 6 (six) hours as needed for wheezing or shortness of breath.     [provider]  budesonide-formoterol (SYMBICORT) 80-4.5 MCG/ACT inhaler Inhale 2 puffs into the lungs 2 (two) times daily.    [provider]  diphenhydrAMINE (BENADRYL) 25 MG tablet Take 25 mg by mouth every 6 (six) hours as needed.    [provider]  montelukast (SINGULAIR) 10 MG tablet Take 10 mg by mouth at bedtime.    [provider]  predniSONE (STERAPRED UNI-PAK 21 TAB) 10 MG (21) TBPK tablet Take by mouth daily. Take 6 tabs by mouth day 1, then 5 tabs, then 4 tabs, then 3 tabs, 2 tabs, then 1 tab for the last day 10/13/17   Cathie HoopsYu, Elric Tirado V, PA-C  triamcinolone cream (KENALOG) 0.1 % Apply 1 application topically 2 (two)  times daily.    [provider]    Family History No family history on file.  Social History Social History   Tobacco Use  . Smoking status: Former Games developer  . Smokeless tobacco: Never Used  . Tobacco comment: quit over 40 yrs ago  Substance Use Topics  . Alcohol use: No  . Drug use: No     Allergies   Celery oil; Fish allergy; and Latex   Review of Systems Review of Systems  Reason unable to perform ROS: See HPI as above.     Physical Exam Triage Vital Signs ED Triage Vitals  Enc Vitals Group     BP 10/13/17 1735 (!) 176/79     Pulse Rate 10/13/17 1735 75     Resp 10/13/17 1735  16     Temp 10/13/17 1735 98.2 F (36.8 C)     Temp Source 10/13/17 1735 Oral     SpO2 10/13/17 1735 95 %     Weight 10/13/17 1734 208 lb (94.3 kg)     Height 10/13/17 1734 6' (1.829 m)     Head Circumference --      Peak Flow --      Pain Score --      Pain Loc --      Pain Edu? --      Excl. in GC? --    No data found.  Updated Vital Signs BP (!) 176/79   Pulse 75   Temp 98.2 F (36.8 C) (Oral)   Resp 16   Ht 6' (1.829 m)   Wt 208 lb (94.3 kg)   SpO2 95%   BMI 28.21 kg/m   Physical Exam  Constitutional: He is oriented to person, place, and time. He appears well-developed and well-nourished. No distress.  HENT:  Head: Normocephalic and atraumatic.  Eyes: Conjunctivae are normal. Pupils are equal, round, and reactive to light.  Neurological: He is alert and oriented to person, place, and time.  Skin: Skin is warm and dry.  See picture below. No tenderness on palpation. Mild increase warmth.             UC Treatments / Results  Labs (all labs ordered are listed, but only abnormal results are displayed) Labs Reviewed - No data to display  EKG  EKG Interpretation None       Radiology No results found.  Procedures Procedures (including critical care time)  Medications Ordered in UC Medications - No data to display   Initial Impression / Assessment and Plan / UC Course  I have reviewed the triage vital signs and the nursing notes.  Pertinent labs & imaging results that were available during my care of the patient were reviewed by me and considered in my medical decision making (see chart for details).    Prednisone as directed. otc antihistamine for itching. Can start unscented lotion for dry skin. Patient to follow up with PCP/dermatology for further evaluation needed. Return precautions given. Patient expresses understanding and agrees to plan.   Final Clinical Impressions(s) / UC Diagnoses   Final diagnoses:  Eczema, unspecified type     ED Discharge Orders        Ordered    predniSONE (STERAPRED UNI-PAK 21 TAB) 10 MG (21) TBPK tablet  Daily     10/13/17 1834        Belinda Fisher, PA-C 10/13/17 1840

## 2017-10-13 NOTE — Discharge Instructions (Signed)
Start prednisone for eczema. Over the counter allergy medicine such as zyrtec, allegra, claritin for itchiness. If switching allergy medicine, stop benadryl. Apply unscented lotion for the skin. Follow up with PCP/dermatologist for further evaluation and management needed. Monitor for spreading redness, increased warmth, pain, fever, follow up for reevaluation.

## 2017-10-20 ENCOUNTER — Ambulatory Visit (HOSPITAL_COMMUNITY)
Admission: EM | Admit: 2017-10-20 | Discharge: 2017-10-20 | Disposition: A | Discharge status: Home / Self Care | Payer: Medicare Other | Attending: Family Medicine | Admitting: Family Medicine

## 2017-10-20 ENCOUNTER — Encounter (HOSPITAL_COMMUNITY): Payer: Self-pay | Admitting: Emergency Medicine

## 2017-10-20 DIAGNOSIS — M25561 Pain in right knee: Secondary | ICD-10-CM | POA: Diagnosis not present

## 2017-10-20 MED ORDER — MELOXICAM 7.5 MG PO TABS: 7.5000 mg | ORAL_TABLET | Freq: Every day | ORAL | 0 refills | Status: DC

## 2017-10-20 NOTE — ED Triage Notes (Signed)
PT C/O: pt reports he twisted right knee yest and since then has been having pain and swelling  Pain increases w/activity or after long periods of sitting.   TAKING MEDS:   A&O x4... NAD... Ambulatory

## 2017-10-20 NOTE — ED Provider Notes (Signed)
[facility]    CSN: [id number] Arrival date & time: 10/20/17  1644     History   Chief Complaint Chief Complaint  Patient presents with  . Knee Pain    HPI Stephen Hawkins is a 84 y.o. male.   84 year old male comes in for 2 day history of right knee pain after injury. States that he was walking, and had pain after twisting while walking. States had pain and swelling. States pain resolves if resting/sitting, but increases after activity. Has taken tylenol/naproxen without relief. Denies numbness/tingling. States able to walk, but with pain.       Past Medical History:  Diagnosis Date  . Arthritis    knees, right foot, left hand  . Asthma   . Environmental allergies   . GERD (gastroesophageal reflux disease)   . Hypertension    no medicines.  MD watching.  . Pneumonia   . Shortness of breath dyspnea    with exertion  . Vertigo    distant past  . Wears dentures    upper full plate  . Wears hearing aid    bilateral    Patient Active Problem List   Diagnosis Date Noted  . GERD (gastroesophageal reflux disease) 06/28/2012  . Left shoulder pain 06/28/2012  . Chest pain 06/27/2012  . Asthma 06/27/2012  . Hypertension 06/27/2012  . Bradycardia 06/27/2012  . Hypokalemia 06/27/2012    Past Surgical History:  Procedure Laterality Date  . CATARACT EXTRACTION W/PHACO Right 03/10/2016   Procedure: CATARACT EXTRACTION PHACO AND INTRAOCULAR LENS PLACEMENT (IOC);  Surgeon: Lockie Molahadwick Brasington, MD;  [facility];  Service: Ophthalmology;  Laterality: Right;  LATEX allergy  . CATARACT EXTRACTION W/PHACO Left 06/02/2016   Procedure: CATARACT EXTRACTION PHACO AND INTRAOCULAR LENS PLACEMENT (IOC);  Surgeon: Lockie Molahadwick Brasington, MD;  [facility];  Service: Ophthalmology;  Laterality: Left;  . CYSTOSCOPY    . ESOPHAGOGASTRODUODENOSCOPY N/A 10/14/2016   Procedure: ESOPHAGOGASTRODUODENOSCOPY (EGD);  Surgeon: Jeani HawkingPatrick Hung, MD;  [facility];  Service: Endoscopy;  Laterality: N/A;  . HERNIA REPAIR    . 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 Authorizing Provider  albuterol (PROVENTIL HFA;VENTOLIN HFA) 108 (90 BASE) MCG/ACT inhaler Inhale 2 puffs into the lungs every 6 (six) hours as needed for wheezing or shortness of breath.    Yes [provider]  budesonide-formoterol (SYMBICORT) 80-4.5 MCG/ACT inhaler Inhale 2 puffs into the lungs 2 (two) times daily.   Yes [provider]  montelukast (SINGULAIR) 10 MG tablet Take 10 mg by mouth at bedtime.   Yes [provider]  triamcinolone cream (KENALOG) 0.1 % Apply 1 application topically 2 (two) times daily.   Yes [provider]  diphenhydrAMINE (BENADRYL) 25 MG tablet Take 25 mg by mouth every 6 (six) hours as needed.    [provider]  meloxicam (MOBIC) 7.5 MG tablet Take 1 tablet (7.5 mg total) by mouth daily. 10/20/17   Cathie HoopsYu, Norval Slaven V, PA-C  predniSONE (STERAPRED UNI-PAK 21 TAB) 10 MG (21) TBPK tablet Take by mouth daily. Take 6 tabs by mouth day 1, then 5 tabs, then 4 tabs, then 3 tabs, 2 tabs, then 1 tab for the last day 10/13/17   Belinda FisherYu, Traver Meckes V, PA-C    Family History History reviewed. No pertinent family history.  Social  History Social History   Tobacco Use  . Smoking status: Former Games developer  . Smokeless tobacco: Never Used  . Tobacco comment: quit over 40 yrs ago  Substance Use Topics  . Alcohol use: No  . Drug use: No     Allergies   Celery oil; Fish allergy; and Latex   Review of Systems Review of Systems  Reason unable to perform ROS: See HPI as above.     Physical Exam Triage Vital Signs ED Triage Vitals  Enc Vitals Group     BP 10/20/17 1754 (!) 197/91     Pulse Rate 10/20/17 1754 64     Resp 10/20/17 1754 20     Temp 10/20/17 1754 97.6 F (36.4 C)     Temp Source 10/20/17 1754 Oral      SpO2 10/20/17 1754 97 %     Weight --      Height --      Head Circumference --      Peak Flow --      Pain Score 10/20/17 1753 5     Pain Loc --      Pain Edu? --      Excl. in GC? --    No data found.  Updated Vital Signs BP (!) 197/91 (BP Location: Left Arm)   Pulse 64   Temp 97.6 F (36.4 C) (Oral)   Resp 20   SpO2 97%   Physical Exam  Constitutional: He is oriented to person, place, and time. He appears well-developed and well-nourished. No distress.  HENT:  Head: Normocephalic and atraumatic.  Eyes: Conjunctivae are normal. Pupils are equal, round, and reactive to light.  Musculoskeletal:  No obvious swelling noted. No erythema, increased warmth. No tenderness on palpation. Full ROM of knee. Strength normal and equal bilaterally. Sensation intact and equal bilaterally. Pain at joint line with valgus/varus stress.   Neurological: He is alert and oriented to person, place, and time.     UC Treatments / Results  Labs (all labs ordered are listed, but only abnormal results are displayed) Labs Reviewed - No data to display  EKG  EKG Interpretation None       Radiology No results found.  Procedures Procedures (including critical care time)  Medications Ordered in UC Medications - No data to display   Initial Impression / Assessment and Plan / UC Course  I have reviewed the triage vital signs and the nursing notes.  Pertinent labs & imaging results that were available during my care of the patient were reviewed by me and considered in my medical decision making (see chart for details).    Start mobic as directed. Ice compress, elevation, knee sleeve during activity. Discussed concerns for meniscus injury given mechanism of injury and joint line tenderness of varus/valgus stress. Follow up with orthopedics for further evaluation needed. Return precautions given. Patient expresses understanding and agrees to plan.   Final Clinical Impressions(s) / UC  Diagnoses   Final diagnoses:  Acute pain of right knee    ED Discharge Orders        Ordered    meloxicam (MOBIC) 7.5 MG tablet  Daily     10/20/17 1834        Belinda Fisher, PA-C 10/20/17 2047

## 2017-10-20 NOTE — Discharge Instructions (Signed)
As discussed, may have some injury to the cartilage (meniscus)/ligament.  Start Mobic as directed.  Ice compress, elevation, knee sleeve during activity.  Follow-up with orthopedics if symptoms not improving, worsens, does not resolve.

## 2017-10-26 ENCOUNTER — Emergency Department (HOSPITAL_COMMUNITY)
Admission: EM | Admit: 2017-10-26 | Discharge: 2017-10-26 | Disposition: A | Discharge status: Home / Self Care | Payer: BC Managed Care – PPO | Attending: Emergency Medicine | Admitting: Emergency Medicine

## 2017-10-26 ENCOUNTER — Other Ambulatory Visit: Payer: Self-pay

## 2017-10-26 ENCOUNTER — Encounter (HOSPITAL_COMMUNITY): Payer: Self-pay | Admitting: Emergency Medicine

## 2017-10-26 DIAGNOSIS — I10 Essential (primary) hypertension: Secondary | ICD-10-CM | POA: Insufficient documentation

## 2017-10-26 DIAGNOSIS — L2084 Intrinsic (allergic) eczema: Secondary | ICD-10-CM | POA: Diagnosis not present

## 2017-10-26 DIAGNOSIS — Z87891 Personal history of nicotine dependence: Secondary | ICD-10-CM | POA: Diagnosis not present

## 2017-10-26 DIAGNOSIS — R21 Rash and other nonspecific skin eruption: Secondary | ICD-10-CM | POA: Diagnosis present

## 2017-10-26 DIAGNOSIS — Z79899 Other long term (current) drug therapy: Secondary | ICD-10-CM | POA: Insufficient documentation

## 2017-10-26 MED ORDER — HYDROXYZINE HCL 25 MG PO TABS: 25.0000 mg | ORAL_TABLET | Freq: Four times a day (QID) | ORAL | 0 refills | Status: DC

## 2017-10-26 MED ORDER — PREDNISONE 50 MG PO TABS: 50.0000 mg | ORAL_TABLET | Freq: Every day | ORAL | 0 refills | Status: DC

## 2017-10-26 MED ORDER — IOPAMIDOL (ISOVUE-300) INJECTION 61%
INTRAVENOUS | Status: AC
  Filled 2017-10-26: qty 150

## 2017-10-26 MED ORDER — HYDROXYZINE HCL 25 MG PO TABS
25.0000 mg | ORAL_TABLET | Freq: Once | ORAL | Status: AC
  Administered 2017-10-26: 25 mg via ORAL
  Filled 2017-10-26: qty 1

## 2017-10-26 MED ORDER — TRIAMCINOLONE ACETONIDE 0.1 % EX CREA: 1.0000 "application " | TOPICAL_CREAM | Freq: Two times a day (BID) | CUTANEOUS | 0 refills | Status: DC

## 2017-10-26 MED ORDER — DEXAMETHASONE SODIUM PHOSPHATE 10 MG/ML IJ SOLN
10.0000 mg | Freq: Once | INTRAMUSCULAR | Status: AC
  Administered 2017-10-26: 10 mg via INTRAVENOUS
  Filled 2017-10-26: qty 1

## 2017-10-26 NOTE — ED Triage Notes (Signed)
Patient c/o rash all over body for about week. Patient reports that he had this rash before and went to urgent care where given something which helped rash but now back. Patient reports very itchy.

## 2017-10-26 NOTE — Discharge Instructions (Signed)
Return here as needed. Follow up with Dermatology

## 2017-10-27 NOTE — ED Provider Notes (Signed)
[facility]EMERGENCY DEPT Provider Note   CSN: [id number] Arrival date & time: 10/26/17  1306     History   Chief Complaint Chief Complaint  Patient presents with  . Rash    HPI Stephen Hawkins is a 84 y.o. male.  HPI Patient presents to the emergency department with diffuse eczema.  The patient states that he has not seen any specialist about this.  Only his primary doctor who prescribed medications.  Patient states that it recently got worse over the last month and a half.  Patient states this happens every winter especially.  Patient states that he was given a prednisone pack which seemed to clear the areas but then they returned once he stopped the medication.  The patient denies chest pain, shortness of breath, headache,blurred vision, neck pain, fever, cough, weakness, numbness, dizziness, anorexia, edema, abdominal pain, nausea, vomiting, diarrhea,back pain, dysuria, hematemesis, bloody stool, near syncope, or syncope. Past Medical History:  Diagnosis Date  . Arthritis    knees, right foot, left hand  . Asthma   . Environmental allergies   . GERD (gastroesophageal reflux disease)   . Hypertension    no medicines.  MD watching.  . Pneumonia   . Shortness of breath dyspnea    with exertion  . Vertigo    distant past  . Wears dentures    upper full plate  . Wears hearing aid    bilateral    Patient Active Problem List   Diagnosis Date Noted  . GERD (gastroesophageal reflux disease) 06/28/2012  . Left shoulder pain 06/28/2012  . Chest pain 06/27/2012  . Asthma 06/27/2012  . Hypertension 06/27/2012  . Bradycardia 06/27/2012  . Hypokalemia 06/27/2012    Past Surgical History:  Procedure Laterality Date  . CATARACT EXTRACTION W/PHACO Right 03/10/2016   Procedure: CATARACT EXTRACTION PHACO AND INTRAOCULAR LENS PLACEMENT (IOC);  Surgeon: Lockie Molahadwick Brasington, MD;  [facility];  Service: Ophthalmology;  Laterality: Right;   LATEX allergy  . CATARACT EXTRACTION W/PHACO Left 06/02/2016   Procedure: CATARACT EXTRACTION PHACO AND INTRAOCULAR LENS PLACEMENT (IOC);  Surgeon: Lockie Molahadwick Brasington, MD;  [facility];  Service: Ophthalmology;  Laterality: Left;  . CYSTOSCOPY    . ESOPHAGOGASTRODUODENOSCOPY N/A 10/14/2016   Procedure: ESOPHAGOGASTRODUODENOSCOPY (EGD);  Surgeon: Jeani HawkingPatrick Hung, MD;  [facility];  Service: Endoscopy;  Laterality: N/A;  . HERNIA REPAIR    . 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 Authorizing Provider  albuterol (PROVENTIL HFA;VENTOLIN HFA) 108 (90 BASE) MCG/ACT inhaler Inhale 2 puffs into the lungs every 6 (six) hours as needed for wheezing or shortness of breath.    Yes [provider]  budesonide-formoterol (SYMBICORT) 80-4.5 MCG/ACT inhaler Inhale 2 puffs into the lungs 2 (two) times daily.   Yes [provider]  diphenhydrAMINE (BENADRYL) 2 % cream Apply 1 application topically 3 (three) times daily as needed for itching.   Yes [provider]  diphenhydrAMINE (BENADRYL) 25 MG tablet Take 25 mg by mouth every 6 (six) hours as needed.   Yes [provider]  montelukast (SINGULAIR) 10 MG tablet Take 10 mg by mouth at bedtime.   Yes [provider]  Tea Tree Oil OIL Apply 1 application topically 3 (three) times daily as needed (ITCHING).   Yes [provider]  hydrOXYzine (ATARAX/VISTARIL) 25  MG tablet Take 1 tablet (25 mg total) by mouth every 6 (six) hours. 10/26/17   Krystine Pabst, Cristal Deer, PA-C  meloxicam (MOBIC) 7.5 MG tablet Take 1 tablet (7.5 mg total) by mouth daily. 10/20/17   Cathie Hoops, Amy V, PA-C  predniSONE (DELTASONE) 50 MG tablet Take 1 tablet (50 mg total) by mouth daily. 10/26/17   Tyrina Hines, Cristal Deer, PA-C  triamcinolone cream (KENALOG) 0.1 % Apply 1 application topically 2 (two) times daily. 10/26/17    Kamil Hanigan, Cristal Deer, PA-C    Family History No family history on file.  Social History Social History   Tobacco Use  . Smoking status: Former Games developer  . Smokeless tobacco: Never Used  . Tobacco comment: quit over 40 yrs ago  Substance Use Topics  . Alcohol use: No  . Drug use: No     Allergies   Celery oil; Fish allergy; and Latex   Review of Systems Review of Systems All other systems negative except as documented in the HPI. All pertinent positives and negatives as reviewed in the HPI.  Physical Exam Updated Vital Signs BP 131/88 (BP Location: Left Arm)   Pulse 78   Temp 97.8 F (36.6 C) (Oral)   Resp 18   Ht 6' (1.829 m)   Wt 94.3 kg (208 lb)   SpO2 99%   BMI 28.21 kg/m   Physical Exam  Constitutional: He is oriented to person, place, and time. He appears well-developed and well-nourished. No distress.  HENT:  Head: Normocephalic and atraumatic.  Eyes: Pupils are equal, round, and reactive to light.  Pulmonary/Chest: Effort normal.  Neurological: He is alert and oriented to person, place, and time.  Skin: Skin is warm and dry. Rash noted.  Psychiatric: He has a normal mood and affect.  Nursing note and vitals reviewed.    ED Treatments / Results  Labs (all labs ordered are listed, but only abnormal results are displayed) Labs Reviewed - No data to display  EKG  EKG Interpretation None       Radiology No results found.  Procedures Procedures (including critical care time)  Medications Ordered in ED Medications  dexamethasone (DECADRON) injection 10 mg (10 mg Intravenous Given 10/26/17 2150)  hydrOXYzine (ATARAX/VISTARIL) tablet 25 mg (25 mg Oral Given 10/26/17 2149)     Initial Impression / Assessment and Plan / ED Course  I have reviewed the triage vital signs and the nursing notes.  Pertinent labs & imaging results that were available during my care of the patient were reviewed by me and considered in my medical decision making (see  chart for details).    Patient was given steroid injection along with Kenalog cream and oral steroids.  We will have the patient follow-up with dermatology as scheduled.  Patient is advised to return here as needed   Final Clinical Impressions(s) / ED Diagnoses   Final diagnoses:  Intrinsic eczema    ED Discharge Orders        Ordered    predniSONE (DELTASONE) 50 MG tablet  Daily     10/26/17 2141    hydrOXYzine (ATARAX/VISTARIL) 25 MG tablet  Every 6 hours     10/26/17 2141    triamcinolone cream (KENALOG) 0.1 %  2 times daily     10/26/17 2141       Charlestine Night, PA-C 10/27/17 0038    Pricilla Loveless, MD 10/29/17 0700

## 2018-01-21 ENCOUNTER — Emergency Department (HOSPITAL_COMMUNITY)
Admission: EM | Admit: 2018-01-21 | Discharge: 2018-01-21 | Disposition: A | Discharge status: Home / Self Care | Payer: BC Managed Care – PPO | Attending: Emergency Medicine | Admitting: Emergency Medicine

## 2018-01-21 ENCOUNTER — Other Ambulatory Visit: Payer: Self-pay

## 2018-01-21 ENCOUNTER — Encounter (HOSPITAL_COMMUNITY): Payer: Self-pay | Admitting: Emergency Medicine

## 2018-01-21 ENCOUNTER — Emergency Department (HOSPITAL_COMMUNITY): Payer: BC Managed Care – PPO

## 2018-01-21 DIAGNOSIS — J45909 Unspecified asthma, uncomplicated: Secondary | ICD-10-CM | POA: Insufficient documentation

## 2018-01-21 DIAGNOSIS — R05 Cough: Secondary | ICD-10-CM | POA: Diagnosis present

## 2018-01-21 DIAGNOSIS — R059 Cough, unspecified: Secondary | ICD-10-CM

## 2018-01-21 DIAGNOSIS — I10 Essential (primary) hypertension: Secondary | ICD-10-CM | POA: Diagnosis not present

## 2018-01-21 DIAGNOSIS — Z79899 Other long term (current) drug therapy: Secondary | ICD-10-CM | POA: Diagnosis not present

## 2018-01-21 DIAGNOSIS — R0981 Nasal congestion: Secondary | ICD-10-CM | POA: Diagnosis not present

## 2018-01-21 DIAGNOSIS — Z91040 Latex allergy status: Secondary | ICD-10-CM | POA: Diagnosis not present

## 2018-01-21 MED ORDER — PREDNISONE 20 MG PO TABS: 40.0000 mg | ORAL_TABLET | Freq: Every day | ORAL | 0 refills | Status: DC

## 2018-01-21 MED ORDER — PREDNISONE 20 MG PO TABS
60.0000 mg | ORAL_TABLET | ORAL | Status: AC
  Administered 2018-01-21: 60 mg via ORAL
  Filled 2018-01-21: qty 3

## 2018-01-21 MED ORDER — LORATADINE 10 MG PO TABS: 10.0000 mg | ORAL_TABLET | Freq: Every day | ORAL | 0 refills | Status: DC

## 2018-01-21 MED ORDER — LORATADINE 10 MG PO TABS
10.0000 mg | ORAL_TABLET | Freq: Once | ORAL | Status: AC
  Administered 2018-01-21: 10 mg via ORAL
  Filled 2018-01-21: qty 1

## 2018-01-21 NOTE — ED Notes (Signed)
Pt endorses productive cough x1 week with some throat soreness and thinks the pollen is messing up his sinuses. NO shob or CP

## 2018-01-21 NOTE — Discharge Instructions (Signed)
As discussed, your evaluation today has been largely reassuring.  But, it is important that you monitor your condition carefully, and do not hesitate to return to the ED if you develop new, or concerning changes in your condition. ? ?Otherwise, please follow-up with your physician for appropriate ongoing care. ? ?

## 2018-01-21 NOTE — ED Provider Notes (Signed)
[facility] Provider [facility]   CSN: [id number] Arrival date & time: 01/21/18  [id number]     History   Chief Complaint Chief Complaint  Patient presents with  . Cough  . Facial Pain    HPI Stephen Hawkins is a 84 y.o. male.  HPI Patient presents with concern of sinus congestion, cough. No fever, no chills, no syncope, no chest pain. Onset was a few days ago, and since onset, in spite of taking all of his usual medication including albuterol symptoms have been persistent. Patient does have asthma, but states that he is otherwise generally well, works.  Past Medical History:  Diagnosis Date  . Arthritis    knees, right foot, left hand  . Asthma   . Environmental allergies   . GERD (gastroesophageal reflux disease)   . Hypertension    no medicines.  MD watching.  . Pneumonia   . Shortness of breath dyspnea    with exertion  . Vertigo    distant past  . Wears dentures    upper full plate  . Wears hearing aid    bilateral    Patient Active Problem List   Diagnosis Date Noted  . GERD (gastroesophageal reflux disease) 06/28/2012  . Left shoulder pain 06/28/2012  . Chest pain 06/27/2012  . Asthma 06/27/2012  . Hypertension 06/27/2012  . Bradycardia 06/27/2012  . Hypokalemia 06/27/2012    Past Surgical History:  Procedure Laterality Date  . CATARACT EXTRACTION W/PHACO Right 03/10/2016   Procedure: CATARACT EXTRACTION PHACO AND INTRAOCULAR LENS PLACEMENT (IOC);  Surgeon: Lockie Molahadwick Brasington, MD;  [facility];  Service: Ophthalmology;  Laterality: Right;  LATEX allergy  . CATARACT EXTRACTION W/PHACO Left 06/02/2016   Procedure: CATARACT EXTRACTION PHACO AND INTRAOCULAR LENS PLACEMENT (IOC);  Surgeon: Lockie Molahadwick Brasington, MD;  [facility];  Service: Ophthalmology;  Laterality: Left;  . CYSTOSCOPY    . ESOPHAGOGASTRODUODENOSCOPY N/A 10/14/2016   Procedure: ESOPHAGOGASTRODUODENOSCOPY (EGD);  Surgeon:  Jeani HawkingPatrick Hung, MD;  [facility];  Service: Endoscopy;  Laterality: N/A;  . HERNIA REPAIR    . 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 Authorizing Provider  albuterol (PROVENTIL HFA;VENTOLIN HFA) 108 (90 BASE) MCG/ACT inhaler Inhale 2 puffs into the lungs every 6 (six) hours as needed for wheezing or shortness of breath.     [provider]  budesonide-formoterol (SYMBICORT) 80-4.5 MCG/ACT inhaler Inhale 2 puffs into the lungs 2 (two) times daily.    [provider]  diphenhydrAMINE (BENADRYL) 2 % cream Apply 1 application topically 3 (three) times daily as needed for itching.    [provider]  diphenhydrAMINE (BENADRYL) 25 MG tablet Take 25 mg by mouth every 6 (six) hours as needed.    [provider]  hydrOXYzine (ATARAX/VISTARIL) 25 MG tablet Take 1 tablet (25 mg total) by mouth every 6 (six) hours. 10/26/17   Lawyer, Cristal Deerhristopher, PA-C  loratadine (CLARITIN) 10 MG tablet Take 1 tablet (10 mg total) by mouth daily. 01/21/18   Gerhard MunchLockwood, Oviya Ammar, MD  meloxicam (MOBIC) 7.5 MG tablet Take 1 tablet (7.5 mg total) by mouth daily. 10/20/17   Cathie HoopsYu, Amy V, PA-C  montelukast (SINGULAIR) 10 MG tablet Take 10 mg by mouth at bedtime.    [provider]  predniSONE (DELTASONE) 20 MG tablet Take 2 tablets (40  mg total) by mouth daily with breakfast. For the next four days 01/21/18   Gerhard Munch, MD  Tea Tree Oil OIL Apply 1 application topically 3 (three) times daily as needed (ITCHING).    [provider]  triamcinolone cream (KENALOG) 0.1 % Apply 1 application topically 2 (two) times daily. 10/26/17   Lawyer, Cristal Deer, PA-C    Family History No family history on file.  Social History Social History   Tobacco Use  . Smoking status: Former Games developer  . Smokeless tobacco: Never Used  . Tobacco comment: quit over 40 yrs ago    Substance Use Topics  . Alcohol use: No  . Drug use: No     Allergies   Celery oil; Fish allergy; and Latex   Review of Systems Review of Systems  Constitutional:       Per HPI, otherwise negative  HENT:       Per HPI, otherwise negative  Respiratory:       Per HPI, otherwise negative  Cardiovascular:       Per HPI, otherwise negative  Gastrointestinal: Negative for vomiting.  Endocrine:       Negative aside from HPI  Genitourinary:       Neg aside from HPI   Musculoskeletal:       Per HPI, otherwise negative  Skin: Negative.   Neurological: Negative for syncope.     Physical Exam Updated Vital Signs BP 137/71 (BP Location: Right Arm)   Pulse 67   Temp 98.4 F (36.9 C) (Oral)   Resp 17   Ht 6' (1.829 m)   Wt 93.4 kg (206 lb)   SpO2 99%   BMI 27.94 kg/m   Physical Exam  Constitutional: He is oriented to person, place, and time. He appears well-developed. No distress.  HENT:  Head: Normocephalic and atraumatic.  Eyes: Conjunctivae and EOM are normal.  Cardiovascular: Normal rate and regular rhythm.  Pulmonary/Chest: Effort normal. No stridor. No respiratory distress.  Abdominal: He exhibits no distension.  Musculoskeletal: He exhibits no edema.  Neurological: He is alert and oriented to person, place, and time.  Skin: Skin is warm and dry.  Psychiatric: He has a normal mood and affect.  Nursing note and vitals reviewed.    ED Treatments / Results  Labs (all labs ordered are listed, but only abnormal results are displayed) Labs Reviewed - No data to display  EKG None  Radiology Dg Chest 2 View  Result Date: 01/21/2018 CLINICAL DATA:  84 year old with coughing up mucus and throat soreness. EXAM: CHEST - 2 VIEW COMPARISON:  01/19/2016 FINDINGS: Coarse lung markings at the left lung base are unchanged. No focal airspace disease or pulmonary edema. Heart and mediastinum are within normal limits. Atherosclerotic calcifications at the aortic arch.  Trachea is midline. Bridging osteophytes in the thoracic spine. No large pleural effusions. IMPRESSION: Chronic lung changes without acute findings. Electronically Signed   By: Richarda Overlie M.D.   On: 01/21/2018 10:35    Procedures Procedures (including critical care time)  Medications Ordered in ED Medications  predniSONE (DELTASONE) tablet 60 mg (has no administration in time range)  loratadine (CLARITIN) tablet 10 mg (has no administration in time range)     Initial Impression / Assessment and Plan / ED Course  I have reviewed the triage vital signs and the nursing notes.  Pertinent labs & imaging results that were available during my care of the patient were reviewed by me and considered in my medical decision making (see  chart for details).  Appearing elderly male with history of reactive airway disease presents with ongoing cough, congestion, sinus drainage. Patient is awake, alert, afebrile, no evidence for bacteremia, sepsis, and x-ray does not demonstrate pneumonia. Patient may have sinus congestion, and given his history of reactive airway disease, some suspicion for exacerbation of that as well. Patient started on steroids, continuing albuterol, and new histamine blocker, discharged with outpatient follow-up.  Final Clinical Impressions(s) / ED Diagnoses   Final diagnoses:  Cough  Sinus congestion    ED Discharge Orders        Ordered    predniSONE (DELTASONE) 20 MG tablet  Daily with breakfast     01/21/18 1102    loratadine (CLARITIN) 10 MG tablet  Daily     01/21/18 1102       Gerhard Munch, MD 01/21/18 1105

## 2018-01-21 NOTE — ED Notes (Signed)
Patient transported to X-ray 

## 2018-01-21 NOTE — ED Notes (Signed)
Patient able to ambulate independently  

## 2018-01-21 NOTE — ED Triage Notes (Signed)
Pt. Stated, Stephen Hawkins had a cough for a week and I think its my sinuses bothering me.

## 2018-07-03 ENCOUNTER — Other Ambulatory Visit: Payer: Self-pay | Admitting: Cardiology

## 2018-07-03 ENCOUNTER — Other Ambulatory Visit (HOSPITAL_COMMUNITY): Payer: Self-pay | Admitting: Cardiology

## 2018-07-03 DIAGNOSIS — I712 Thoracic aortic aneurysm, without rupture, unspecified: Secondary | ICD-10-CM

## 2018-07-05 ENCOUNTER — Ambulatory Visit
Admission: RE | Admit: 2018-07-05 | Discharge: 2018-07-05 | Disposition: A | Discharge status: Home / Self Care | Payer: BC Managed Care – PPO | Source: Ambulatory Visit | Attending: Cardiology | Admitting: Cardiology

## 2018-07-05 DIAGNOSIS — I712 Thoracic aortic aneurysm, without rupture, unspecified: Secondary | ICD-10-CM

## 2018-07-05 MED ORDER — IOPAMIDOL (ISOVUE-370) INJECTION 76%
75.0000 mL | Freq: Once | INTRAVENOUS | Status: AC | PRN
  Administered 2018-07-05: 75 mL via INTRAVENOUS

## 2018-12-23 ENCOUNTER — Encounter (HOSPITAL_COMMUNITY): Payer: Self-pay

## 2018-12-23 ENCOUNTER — Emergency Department (HOSPITAL_COMMUNITY): Payer: Medicare Other

## 2018-12-23 ENCOUNTER — Other Ambulatory Visit: Payer: Self-pay

## 2018-12-23 ENCOUNTER — Emergency Department (HOSPITAL_COMMUNITY)
Admission: EM | Admit: 2018-12-23 | Discharge: 2018-12-23 | Disposition: A | Discharge status: Home / Self Care | Payer: Medicare Other | Attending: Emergency Medicine | Admitting: Emergency Medicine

## 2018-12-23 DIAGNOSIS — Z87891 Personal history of nicotine dependence: Secondary | ICD-10-CM | POA: Insufficient documentation

## 2018-12-23 DIAGNOSIS — I10 Essential (primary) hypertension: Secondary | ICD-10-CM | POA: Insufficient documentation

## 2018-12-23 DIAGNOSIS — R05 Cough: Secondary | ICD-10-CM | POA: Diagnosis present

## 2018-12-23 DIAGNOSIS — Z79899 Other long term (current) drug therapy: Secondary | ICD-10-CM | POA: Insufficient documentation

## 2018-12-23 DIAGNOSIS — J4521 Mild intermittent asthma with (acute) exacerbation: Secondary | ICD-10-CM | POA: Insufficient documentation

## 2018-12-23 DIAGNOSIS — Z91040 Latex allergy status: Secondary | ICD-10-CM | POA: Diagnosis not present

## 2018-12-23 LAB — COMPREHENSIVE METABOLIC PANEL
ALBUMIN: 3.6 g/dL (ref 3.5–5.0)
ALT: 16 U/L (ref 0–44)
AST: 20 U/L (ref 15–41)
Alkaline Phosphatase: 35 U/L — ABNORMAL LOW (ref 38–126)
Anion gap: 7 (ref 5–15)
BUN: 12 mg/dL (ref 8–23)
CHLORIDE: 106 mmol/L (ref 98–111)
CO2: 23 mmol/L (ref 22–32)
Calcium: 8.3 mg/dL — ABNORMAL LOW (ref 8.9–10.3)
Creatinine, Ser: 1.06 mg/dL (ref 0.61–1.24)
GFR calc Af Amer: >60 mL/min (ref >60)
Glucose, Bld: 109 mg/dL — ABNORMAL HIGH (ref 70–99)
Potassium: 4.1 mmol/L (ref 3.5–5.1)
SODIUM: 136 mmol/L (ref 135–145)
Total Bilirubin: 0.4 mg/dL (ref 0.3–1.2)
Total Protein: 6.7 g/dL (ref 6.5–8.1)

## 2018-12-23 LAB — CBC WITH DIFFERENTIAL/PLATELET
Abs Immature Granulocytes: 0.01 10*3/uL (ref 0.00–0.07)
BASOS ABS: 0 10*3/uL (ref 0.0–0.1)
BASOS PCT: 0 %
Eosinophils Absolute: 0.1 10*3/uL (ref 0.0–0.5)
Eosinophils Relative: 1 %
HEMATOCRIT: 38 % — AB (ref 39.0–52.0)
Hemoglobin: 12.7 g/dL — ABNORMAL LOW (ref 13.0–17.0)
IMMATURE GRANULOCYTES: 0 %
LYMPHS ABS: 2.1 10*3/uL (ref 0.7–4.0)
Lymphocytes Relative: 36 %
MCH: 30 pg (ref 26.0–34.0)
MCHC: 33.4 g/dL (ref 30.0–36.0)
MCV: 89.6 fL (ref 80.0–100.0)
Monocytes Absolute: 0.4 10*3/uL (ref 0.1–1.0)
Monocytes Relative: 7 %
NEUTROS PCT: 56 %
NRBC: 0 % (ref 0.0–0.2)
Neutro Abs: 3.2 10*3/uL (ref 1.7–7.7)
PLATELETS: 234 10*3/uL (ref 150–400)
RBC: 4.24 MIL/uL (ref 4.22–5.81)
RDW: 13.1 % (ref 11.5–15.5)
WBC: 5.8 10*3/uL (ref 4.0–10.5)

## 2018-12-23 MED ORDER — IPRATROPIUM-ALBUTEROL 0.5-2.5 (3) MG/3ML IN SOLN
3.0000 mL | Freq: Once | RESPIRATORY_TRACT | Status: AC
  Administered 2018-12-23: 3 mL via RESPIRATORY_TRACT
  Filled 2018-12-23: qty 3

## 2018-12-23 MED ORDER — METHYLPREDNISOLONE SODIUM SUCC 125 MG IJ SOLR
125.0000 mg | Freq: Once | INTRAMUSCULAR | Status: AC
  Administered 2018-12-23: 125 mg via INTRAVENOUS
  Filled 2018-12-23: qty 2

## 2018-12-23 MED ORDER — PREDNISONE 20 MG PO TABS: 40.0000 mg | ORAL_TABLET | Freq: Every day | ORAL | 0 refills | Status: DC

## 2018-12-23 MED ORDER — GUAIFENESIN-CODEINE 100-10 MG/5ML PO SOLN
10.0000 mL | Freq: Once | ORAL | Status: AC
Start: 2018-12-23 — End: 2018-12-23
  Administered 2018-12-23: 10 mL via ORAL
  Filled 2018-12-23: qty 10

## 2018-12-23 NOTE — ED Triage Notes (Addendum)
Pt has a PMHx of asthma & a week ago he began coughing in spells that make it hard for him to catch his breath, he became incapable to tolerate it anymore & came in to ED tonight. Pt does admit to some SOB as well.

## 2018-12-23 NOTE — ED Notes (Signed)
Patient verbalizes understanding of discharge instructions. Opportunity for questioning and answers were provided. Armband removed by staff, pt discharged from ED by wheelchair   

## 2018-12-23 NOTE — ED Provider Notes (Signed)
[facility] Provider Note   CSN: [id number] Arrival date & time: 12/23/18  1831    History   Chief Complaint Chief Complaint  Patient presents with  . Cough    HPI Stephen Hawkins is a 85 y.o. male.     85 year old male with history of asthma, hypertension, GERD, seasonal allergies, vertigo who presents with wheezing and shortness of breath.  Patient states that his allergies have been acting up recently and he has been having problems with asthma.  Over the past week, he has had a cough productive of white phlegm associated with shortness of breath and wheezing.  This feels like his typical asthma exacerbation.  He has been using his inhalers as prescribed, last breathing treatment was this afternoon.  He reports that recently his albuterol does not seem to be helping.  He denies any associated chest or abdominal pain.  No fevers or sick contacts.  No recent travel.  He denies any leg swelling or pain.  The history is provided by the patient.    Past Medical History:  Diagnosis Date  . Arthritis    knees, right foot, left hand  . Asthma   . Environmental allergies   . GERD (gastroesophageal reflux disease)   . Hypertension    no medicines.  MD watching.  . Pneumonia   . Shortness of breath dyspnea    with exertion  . Vertigo    distant past  . Wears dentures    upper full plate  . Wears hearing aid    bilateral    Patient Active Problem List   Diagnosis Date Noted  . GERD (gastroesophageal reflux disease) 06/28/2012  . Left shoulder pain 06/28/2012  . Chest pain 06/27/2012  . Asthma 06/27/2012  . Hypertension 06/27/2012  . Bradycardia 06/27/2012  . Hypokalemia 06/27/2012    Past Surgical History:  Procedure Laterality Date  . CATARACT EXTRACTION W/PHACO Right 03/10/2016   Procedure: CATARACT EXTRACTION PHACO AND INTRAOCULAR LENS PLACEMENT (IOC);  Surgeon: Lockie Molahadwick Brasington, MD;  [facility];   Service: Ophthalmology;  Laterality: Right;  LATEX allergy  . CATARACT EXTRACTION W/PHACO Left 06/02/2016   Procedure: CATARACT EXTRACTION PHACO AND INTRAOCULAR LENS PLACEMENT (IOC);  Surgeon: Lockie Molahadwick Brasington, MD;  [facility];  Service: Ophthalmology;  Laterality: Left;  . CYSTOSCOPY    . ESOPHAGOGASTRODUODENOSCOPY N/A 10/14/2016   Procedure: ESOPHAGOGASTRODUODENOSCOPY (EGD);  Surgeon: Jeani HawkingPatrick Hung, MD;  [facility];  Service: Endoscopy;  Laterality: N/A;  . HERNIA REPAIR    . 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 Authorizing Provider  albuterol (PROVENTIL HFA;VENTOLIN HFA) 108 (90 BASE) MCG/ACT inhaler Inhale 2 puffs into the lungs every 6 (six) hours as needed for wheezing or shortness of breath.     [provider]  budesonide-formoterol (SYMBICORT) 80-4.5 MCG/ACT inhaler Inhale 2 puffs into the lungs 2 (two) times daily.    [provider]  diphenhydrAMINE (BENADRYL) 2 % cream Apply 1 application topically 3 (three) times daily as needed for itching.    [provider]  diphenhydrAMINE (BENADRYL) 25 MG tablet Take 25 mg by mouth every 6 (six) hours as needed.    [provider]  hydrOXYzine (ATARAX/VISTARIL) 25 MG tablet Take 1 tablet (25 mg total) by mouth every 6 (six) hours. 10/26/17  Lawyer, Cristal Deer, PA-C  loratadine (CLARITIN) 10 MG tablet Take 1 tablet (10 mg total) by mouth daily. 01/21/18   Gerhard Munch, MD  meloxicam (MOBIC) 7.5 MG tablet Take 1 tablet (7.5 mg total) by mouth daily. 10/20/17   Cathie Hoops, Amy V, PA-C  montelukast (SINGULAIR) 10 MG tablet Take 10 mg by mouth at bedtime.    [provider]  predniSONE (DELTASONE) 20 MG tablet Take 2 tablets (40 mg total) by mouth daily. 12/23/18   Ethin Drummond, Ambrose Finland, MD  Tea Tree Oil OIL Apply 1 application topically 3 (three) times daily as  needed (ITCHING).    [provider]  triamcinolone cream (KENALOG) 0.1 % Apply 1 application topically 2 (two) times daily. 10/26/17   Charlestine Night, PA-C    Family History History reviewed. No pertinent family history.  Social History Social History   Tobacco Use  . Smoking status: Former Games developer  . Smokeless tobacco: Never Used  . Tobacco comment: quit over 40 yrs ago  Substance Use Topics  . Alcohol use: No  . Drug use: No     Allergies   Celery oil; Fish allergy; and Latex   Review of Systems Review of Systems All other systems reviewed and are negative except that which was mentioned in HPI   Physical Exam Updated Vital Signs BP (!) 149/69   Pulse 70   Temp 98.3 F (36.8 C) (Oral)   Resp (!) 22   SpO2 93%   Physical Exam Vitals signs and nursing note reviewed.  Constitutional:      General: He is not in acute distress.    Appearance: He is well-developed.  HENT:     Head: Normocephalic and atraumatic.     Mouth/Throat:     Mouth: Mucous membranes are moist.     Pharynx: Oropharynx is clear.  Eyes:     Conjunctiva/sclera: Conjunctivae normal.  Neck:     Musculoskeletal: Neck supple.  Cardiovascular:     Rate and Rhythm: Normal rate and regular rhythm.     Heart sounds: Normal heart sounds. No murmur.  Pulmonary:     Effort: Pulmonary effort is normal.     Breath sounds: Wheezing present.     Comments: inspiratory and expiratory wheezes bilaterally, normal work of breathing Abdominal:     General: Bowel sounds are normal. There is no distension.     Palpations: Abdomen is soft.     Tenderness: There is no abdominal tenderness.  Musculoskeletal:     Right lower leg: No edema.     Left lower leg: No edema.  Skin:    General: Skin is warm and dry.  Neurological:     Mental Status: He is alert and oriented to person, place, and time.     Comments: Fluent speech  Psychiatric:        Judgment: Judgment normal.      ED Treatments  / Results  Labs (all labs ordered are listed, but only abnormal results are displayed) Labs Reviewed  COMPREHENSIVE METABOLIC PANEL - Abnormal; Notable for the following components:      Result Value   Glucose, Bld 109 (*)    Calcium 8.3 (*)    Alkaline Phosphatase 35 (*)    All other components within normal limits  CBC WITH DIFFERENTIAL/PLATELET - Abnormal; Notable for the following components:   Hemoglobin 12.7 (*)    HCT 38.0 (*)    All other components within normal limits    EKG EKG Interpretation  Date/Time:  Saturday December 23 2018 18:44:13 EDT Ventricular Rate:  67 PR Interval:    QRS Duration: 79 QT Interval:  394 QTC Calculation: 416 R Axis:   55 Text Interpretation:  Sinus rhythm No significant change since last tracing Confirmed by Frederick Peers [phone]) on 12/23/2018 6:56:40 PM   Radiology Dg Chest 2 View  Result Date: 12/23/2018 CLINICAL DATA:  Patient with wheezing. EXAM: CHEST - 2 VIEW COMPARISON:  Chest radiograph 01/21/2018 FINDINGS: Monitoring leads overlie the patient. Normal cardiac and mediastinal contours. Aortic atherosclerosis. No consolidative pulmonary opacities. No pleural effusion or pneumothorax. Regional skeleton is unremarkable. IMPRESSION: No acute cardiopulmonary process. Electronically Signed   By: Annia Belt M.D.   On: 12/23/2018 19:53    Procedures Procedures (including critical care time)  Medications Ordered in ED Medications  methylPREDNISolone sodium succinate (SOLU-MEDROL) 125 mg/2 mL injection 125 mg (125 mg Intravenous Given 12/23/18 1903)  ipratropium-albuterol (DUONEB) 0.5-2.5 (3) MG/3ML nebulizer solution 3 mL (3 mLs Nebulization Given 12/23/18 1903)  guaiFENesin-codeine 100-10 MG/5ML solution 10 mL (10 mLs Oral Given 12/23/18 2006)     Initial Impression / Assessment and Plan / ED Course  I have reviewed the triage vital signs and the nursing notes.  Pertinent labs & imaging results that were available during my care of the  patient were reviewed by me and considered in my medical decision making (see chart for details).       Well-appearing, no respiratory distress on arrival.  Afebrile.  He did have bilateral wheezes.  Gave dose of Solu-Medrol and DuoNeb.  Basic lab work is reassuring with normal WBC count and normal CMP.  Chest x-ray clear.  On reassessment, he states that he is much better and wants to go home.  Based on clear chest x-ray, no fevers, normal O2 saturation, I do not feel he needs any antibiotics.  I suspect that his asthma exacerbation may be related to his seasonal allergies or possibly a mild viral URI.  He has albuterol to use at home and understands need to continue his usual home medications.  Will give course of prednisone and have patient follow-up with PCP in a few days.  Reviewed return precautions and he voiced understanding.  Final Clinical Impressions(s) / ED Diagnoses   Final diagnoses:  Exacerbation of intermittent asthma, unspecified asthma severity    ED Discharge Orders         Ordered    predniSONE (DELTASONE) 20 MG tablet  Daily     12/23/18 2019           Jeily Guthridge, Ambrose Finland, MD 12/23/18 2051

## 2018-12-23 NOTE — ED Notes (Signed)
Patient transported to X-ray 

## 2019-01-29 ENCOUNTER — Other Ambulatory Visit: Payer: Self-pay | Admitting: Cardiology

## 2019-01-29 DIAGNOSIS — I351 Nonrheumatic aortic (valve) insufficiency: Secondary | ICD-10-CM

## 2019-01-29 DIAGNOSIS — I10 Essential (primary) hypertension: Secondary | ICD-10-CM

## 2019-06-12 ENCOUNTER — Other Ambulatory Visit: Payer: Medicare Other

## 2019-06-20 ENCOUNTER — Ambulatory Visit: Payer: Medicare Other | Admitting: Cardiology

## 2019-06-26 ENCOUNTER — Other Ambulatory Visit: Payer: Self-pay

## 2019-06-26 ENCOUNTER — Ambulatory Visit (INDEPENDENT_AMBULATORY_CARE_PROVIDER_SITE_OTHER): Payer: Medicare Other

## 2019-06-26 DIAGNOSIS — I351 Nonrheumatic aortic (valve) insufficiency: Secondary | ICD-10-CM | POA: Diagnosis not present

## 2019-06-26 DIAGNOSIS — I10 Essential (primary) hypertension: Secondary | ICD-10-CM

## 2019-07-16 ENCOUNTER — Encounter: Payer: Self-pay | Admitting: Cardiology

## 2019-07-16 ENCOUNTER — Ambulatory Visit (INDEPENDENT_AMBULATORY_CARE_PROVIDER_SITE_OTHER): Payer: Medicare Other | Admitting: Cardiology

## 2019-07-16 ENCOUNTER — Other Ambulatory Visit: Payer: Self-pay

## 2019-07-16 VITALS — BP 152/76 | HR 88 | Temp 97.5°F | Ht 72.0 in | Wt 226.0 lb

## 2019-07-16 DIAGNOSIS — I77810 Thoracic aortic ectasia: Secondary | ICD-10-CM

## 2019-07-16 DIAGNOSIS — I351 Nonrheumatic aortic (valve) insufficiency: Secondary | ICD-10-CM | POA: Diagnosis not present

## 2019-07-16 NOTE — Progress Notes (Signed)
Follow up visit  Subjective:   Stephen Hawkins, male    DOB: [birth year], 85 y.o.   MRN: [id number]   Chief Complaint  Patient presents with  . Hypertension  . Aortic valve disease  . Follow-up    1 yr    HPI  85 y/o [place] American male with hypertension. asymptomatic thoracic aorta dilatation and grade II AI.  Recent echocardiogram in 06/2019 shows stable moderate grade II AI. Aortic root was measured 3.5 cm. This is likely underestimation, compared to 4.0 cm noted on CTA in 06/2018. Nonetheless, no significant increase noted.    Past Medical History:  Diagnosis Date  . Arthritis    knees, right foot, left hand  . Asthma   . Environmental allergies   . GERD (gastroesophageal reflux disease)   . Hypertension    no medicines.  MD watching.  . Pneumonia   . Shortness of breath dyspnea    with exertion  . Vertigo    distant past  . Wears dentures    upper full plate  . Wears hearing aid    bilateral     Past Surgical History:  Procedure Laterality Date  . CATARACT EXTRACTION W/PHACO Right 03/10/2016   Procedure: CATARACT EXTRACTION PHACO AND INTRAOCULAR LENS PLACEMENT (IOC);  Surgeon: Leandrew Koyanagi, MD;  Location: [facility];  Service: Ophthalmology;  Laterality: Right;  LATEX allergy  . CATARACT EXTRACTION W/PHACO Left 06/02/2016   Procedure: CATARACT EXTRACTION PHACO AND INTRAOCULAR LENS PLACEMENT (IOC);  Surgeon: Leandrew Koyanagi, MD;  Location: [place];  Service: Ophthalmology;  Laterality: Left;  . CYSTOSCOPY    . ESOPHAGOGASTRODUODENOSCOPY N/A 10/14/2016   Procedure: ESOPHAGOGASTRODUODENOSCOPY (EGD);  Surgeon: Carol Ada, MD;  Location: [facility] ENDOSCOPY;  Service: Endoscopy;  Laterality: N/A;  . HERNIA REPAIR    . 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 Marital status: Married    Spouse name: Not on file  . Number of children: Not on file  . Years of education: Not on file   . Highest education level: Not on file  Occupational History  . Not on file  Social Needs  . Financial resource strain: Not on file  . Food insecurity    Worry: Not on file    Inability: Not on file  . Transportation needs    Medical: Not on file    Non-medical: Not on file  Tobacco Use  . Smoking status: Former Research scientist (life sciences)  . Smokeless tobacco: Never Used  . Tobacco comment: quit over 40 yrs ago  Substance and Sexual Activity  . Alcohol use: No  . Drug use: No  . Sexual activity: Never  Lifestyle  . Physical activity    Days per week: Not on file    Minutes per session: Not on file  . Stress: Not on file  Relationships  . Social Herbalist on phone: Not on file    Gets together: Not on file    Attends religious service: Not on file    Active member of club or organization: Not on file    Attends meetings of clubs or organizations: Not on file    Relationship status: Not on file  . Intimate partner violence    Fear of current or ex partner: Not on file    Emotionally abused: Not on file    Physically abused: Not on file    Forced  sexual activity: Not on file  Other Topics Concern  . Not on file  Social History Narrative  . Not on file     Family History  Problem Relation Age of Onset  . Stroke Mother   . Hypertension Mother   . Heart attack Sister      Current Outpatient Medications on File Prior to Visit  Medication Sig Dispense Refill  . albuterol (PROVENTIL HFA;VENTOLIN HFA) 108 (90 BASE) MCG/ACT inhaler Inhale 2 puffs into the lungs every 6 (six) hours as needed for wheezing or shortness of breath.     . budesonide-formoterol (SYMBICORT) 80-4.5 MCG/ACT inhaler Inhale 2 puffs into the lungs 2 (two) times daily.    . diphenhydrAMINE (BENADRYL) 2 % cream Apply 1 application topically 3 (three) times daily as needed for itching.    . diphenhydrAMINE (BENADRYL) 25 MG tablet Take 25 mg by mouth every 6 (six) hours as needed.    . hydrOXYzine  (ATARAX/VISTARIL) 25 MG tablet Take 1 tablet (25 mg total) by mouth every 6 (six) hours. 12 tablet 0  . loratadine (CLARITIN) 10 MG tablet Take 1 tablet (10 mg total) by mouth daily. 20 tablet 0  . meloxicam (MOBIC) 7.5 MG tablet Take 1 tablet (7.5 mg total) by mouth daily. 15 tablet 0  . montelukast (SINGULAIR) 10 MG tablet Take 10 mg by mouth at bedtime.    . predniSONE (DELTASONE) 20 MG tablet Take 2 tablets (40 mg total) by mouth daily. 10 tablet 0  . Tea Tree Oil OIL Apply 1 application topically 3 (three) times daily as needed (ITCHING).    Marland Kitchen triamcinolone cream (KENALOG) 0.1 % Apply 1 application topically 2 (two) times daily. 80 g 0   No current facility-administered medications on file prior to visit.     Cardiovascular studies:  EKG 07/16/2019: Sinus rhythm 79 bpm. Normal EKG.  Echocardiogram 06/26/2019: Left ventricle cavity is normal in size. Moderate concentric hypertrophy of the left ventricle. Normal LV systolic function with EF 56%. Normal global wall motion. Normal diastolic filling pattern.  Trileaflet aortic valve. Mild aortic valve leaflet calcification. Moderate (Grade II) aortic regurgitation. Aortic root measures 3.5 cm. Mild (Grade I) mitral regurgitation. Mild tricuspid regurgitation. Estimated pulmonary artery systolic pressure is 22 mmHg. No significant change compared to previous study on 06/23/2018.  CTA Chest 07/05/18: Bovine arch Aorta measurements 4 cm at Sinus of valsalva and mid ascending aorta 3.9 cm at proximal arch. 3.1 cm at STJ 3 cm distal arch 3.2 cm proximal descending aorta 2.6 cm distal descending aorta  Echocardiogram 06/23/2018: Left ventricle cavity is normal in size. Doppler evidence of grade I (impaired) diastolic dysfunction. Calculated EF 77%. Left atrial cavity is mildly dilated. Trileaflet aortic valve with moderate (Grade II) regurgitation. The aortic root is dilated, measuring 3.8 cm at sinus of Valsalva and 4.5 in proximal  ascending aorta.  Inadequate TR jet to estimate PASP. Normal right atrial pressure.  Recommend follow up echocardiogram in one year.  Recent labs: 06/06/2018: Glucose 89.  BUN/creatinine 20/1.15.  EGFR 58.  Sodium 145, potassium 4.5 H/H 12/37.  MCV 89.  Platelets 175. TSH 1.7 normal   Review of Systems  Constitution: Negative for decreased appetite, malaise/fatigue, weight gain and weight loss.  HENT: Negative for congestion.   Eyes: Negative for visual disturbance.  Cardiovascular: Negative for chest pain, dyspnea on exertion, leg swelling, palpitations and syncope.  Respiratory: Negative for cough.   Endocrine: Negative for cold intolerance.  Hematologic/Lymphatic: Does not bruise/bleed easily.  Skin:  Negative for itching and rash.  Musculoskeletal: Negative for myalgias.  Gastrointestinal: Negative for abdominal pain, nausea and vomiting.  Genitourinary: Negative for dysuria.  Neurological: Negative for dizziness and weakness.  Psychiatric/Behavioral: The patient is not nervous/anxious.   All other systems reviewed and are negative.        Vitals:   07/16/19 0954  BP: (!) 152/76  Pulse: 88  Temp: (!) 97.5 F (36.4 C)  SpO2: 96%     Body mass index is 30.65 kg/m. Filed Weights   07/16/19 0954  Weight: 102.5 kg     Objective:   Physical Exam  Constitutional: He is oriented to person, place, and time. He appears well-developed and well-nourished. No distress.  HENT:  Head: Normocephalic and atraumatic.  Eyes: Pupils are equal, round, and reactive to light. Conjunctivae are normal.  Neck: No JVD present.  Cardiovascular: Normal rate, regular rhythm and intact distal pulses.  Murmur heard. High-pitched blowing decrescendo early diastolic murmur is present with a grade of 1/6 at the upper right sternal border radiating to the apex. Pulmonary/Chest: Effort normal and breath sounds normal. He has no wheezes. He has no rales.  Abdominal: Soft. Bowel sounds are  normal. There is no rebound.  Musculoskeletal:        General: No edema.  Lymphadenopathy:    He has no cervical adenopathy.  Neurological: He is alert and oriented to person, place, and time. No cranial nerve deficit.  Skin: Skin is warm and dry.  Psychiatric: He has a normal mood and affect.  Nursing note and vitals reviewed.         Assessment & Recommendations:   85 y/o [place] male with hypertension. asymptomatic thoracic aorta dilatation and grade II AI.  Thoracic aorta dilatation, grade II AI: Largest dimension 4 sm on CTA in 06/2018. Recommend repeat echocardiogram in 06/2020  Hypertension:  While his blood pressure remains suboptimal, he is reluctant to start medical therapy. I have recommended low salt diet and continued follow up with PCP.  I will see him back in 1 year after echocardiogram.   Nigel Mormon, MD [facility]. [place] Pager: [phone] Office: [phone] If no answer Cell [phone]

## 2019-11-07 ENCOUNTER — Ambulatory Visit: Payer: Medicare Other | Attending: Internal Medicine

## 2019-11-07 DIAGNOSIS — Z20822 Contact with and (suspected) exposure to covid-19: Secondary | ICD-10-CM

## 2019-11-08 LAB — NOVEL CORONAVIRUS, NAA: SARS-CoV-2, NAA: NOT DETECTED

## 2020-06-27 ENCOUNTER — Other Ambulatory Visit: Payer: Self-pay

## 2020-06-27 ENCOUNTER — Other Ambulatory Visit: Payer: Medicare Other

## 2020-06-27 DIAGNOSIS — Z20822 Contact with and (suspected) exposure to covid-19: Secondary | ICD-10-CM

## 2020-06-30 LAB — NOVEL CORONAVIRUS, NAA: SARS-CoV-2, NAA: NOT DETECTED

## 2020-07-15 ENCOUNTER — Other Ambulatory Visit: Payer: Medicare Other

## 2020-07-16 ENCOUNTER — Telehealth: Payer: Self-pay

## 2020-07-16 NOTE — Telephone Encounter (Signed)
I called to reschedule patients appointment patient said he isn't have the ECHO done again because last time when he had it done he was in pain for 3 days.

## 2020-07-21 ENCOUNTER — Ambulatory Visit: Payer: Medicare Other | Admitting: Cardiology

## 2020-07-24 ENCOUNTER — Ambulatory Visit
Admission: RE | Admit: 2020-07-24 | Discharge: 2020-07-24 | Disposition: A | Discharge status: Home / Self Care | Payer: Medicare Other | Source: Ambulatory Visit | Attending: Cardiology | Admitting: Cardiology

## 2020-07-24 ENCOUNTER — Encounter: Payer: Self-pay | Admitting: Cardiology

## 2020-07-24 ENCOUNTER — Other Ambulatory Visit: Payer: Self-pay

## 2020-07-24 ENCOUNTER — Ambulatory Visit: Payer: Medicare Other | Admitting: Cardiology

## 2020-07-24 VITALS — BP 189/75 | HR 55 | Resp 16 | Ht 72.0 in | Wt 224.0 lb

## 2020-07-24 DIAGNOSIS — I10 Essential (primary) hypertension: Secondary | ICD-10-CM

## 2020-07-24 DIAGNOSIS — I351 Nonrheumatic aortic (valve) insufficiency: Secondary | ICD-10-CM

## 2020-07-24 DIAGNOSIS — I77810 Thoracic aortic ectasia: Secondary | ICD-10-CM

## 2020-07-24 MED ORDER — IOPAMIDOL (ISOVUE-370) INJECTION 76%
75.0000 mL | Freq: Once | INTRAVENOUS | Status: AC | PRN
  Administered 2020-07-24: 75 mL via INTRAVENOUS

## 2020-07-24 NOTE — Progress Notes (Signed)
Follow up visit  Subjective:   Stephen Hawkins, male    DOB: [birth year], 86 y.o.   MRN: [id number]   Chief Complaint  Patient presents with  . Hypertension  . Aorta root dilatation    HPI  86 y/o [place] American male with hypertension. asymptomatic thoracic aorta dilatation and grade II AI.  Echocardiogram in 06/2019 showed stable moderate grade II AI. Aortic root was measured 3.5 cm. This was likely underestimation, compared to 4.0 cm noted on CTA in 06/2018. Nonetheless, no significant increase was noted.  Patient was recommended to have a follow-up echocardiogram in 06/2020.  However, patient did not want to undergo the study, as it was "painful" in the past.  Blood pressure is elevated.  He reports systolic blood pressures around 150 mmHg at home.  He absolutely refuses to be on any antihypertensive medications.    Current Outpatient Medications on File Prior to Visit  Medication Sig Dispense Refill  . albuterol (PROVENTIL HFA;VENTOLIN HFA) 108 (90 BASE) MCG/ACT inhaler Inhale 2 puffs into the lungs every 6 (six) hours as needed for wheezing or shortness of breath.     . budesonide-formoterol (SYMBICORT) 80-4.5 MCG/ACT inhaler Inhale 2 puffs into the lungs 2 (two) times daily.    . diphenhydrAMINE (BENADRYL) 2 % cream Apply 1 application topically 3 (three) times daily as needed for itching.    . montelukast (SINGULAIR) 10 MG tablet Take 10 mg by mouth at bedtime.    . naproxen sodium (ALEVE) 220 MG tablet Take 220 mg by mouth daily as needed.    . triamcinolone cream (KENALOG) 0.1 % Apply 1 application topically 2 (two) times daily. 80 g 0   No current facility-administered medications on file prior to visit.    Cardiovascular studies:  EKG 07/24/2020: Sinus rhythm 54 bpm Normal EKG  Echocardiogram 06/26/2019: Left ventricle cavity is normal in size. Moderate concentric hypertrophy of the left ventricle. Normal LV systolic function with EF 56%. Normal global wall  motion. Normal diastolic filling pattern.  Trileaflet aortic valve. Mild aortic valve leaflet calcification. Moderate (Grade II) aortic regurgitation. Aortic root measures 3.5 cm. Mild (Grade I) mitral regurgitation. Mild tricuspid regurgitation. Estimated pulmonary artery systolic pressure is 22 mmHg. No significant change compared to previous study on 06/23/2018.  CTA Chest 07/05/18: Bovine arch Aorta measurements 4 cm at Sinus of valsalva and mid ascending aorta 3.9 cm at proximal arch. 3.1 cm at STJ 3 cm distal arch 3.2 cm proximal descending aorta 2.6 cm distal descending aorta  Echocardiogram 06/23/2018: Left ventricle cavity is normal in size. Doppler evidence of grade I (impaired) diastolic dysfunction. Calculated EF 77%. Left atrial cavity is mildly dilated. Trileaflet aortic valve with moderate (Grade II) regurgitation. The aortic root is dilated, measuring 3.8 cm at sinus of Valsalva and 4.5 in proximal ascending aorta.  Inadequate TR jet to estimate PASP. Normal right atrial pressure.  Recommend follow up echocardiogram in one year.  Recent labs: 06/06/2018: Glucose 89.  BUN/creatinine 20/1.15.  EGFR 58.  Sodium 145, potassium 4.5 H/H 12/37.  MCV 89.  Platelets 175. TSH 1.7 normal   Review of Systems  Cardiovascular: Negative for chest pain, dyspnea on exertion, leg swelling, palpitations and syncope.         Vitals:   07/24/20 1015 07/24/20 1021  BP: (!) 188/73 (!) 189/75  Pulse: (!) 57 (!) 55  Resp: 16   SpO2: 98% 98%     Body mass index is 30.38 kg/m. Filed Weights   07/24/20  1015  Weight: 224 lb (101.6 kg)     Objective:   Physical Exam Vitals and nursing note reviewed.  Constitutional:      General: He is not in acute distress. Neck:     Vascular: No JVD.  Cardiovascular:     Rate and Rhythm: Normal rate and regular rhythm.     Heart sounds: Normal heart sounds. No murmur heard.   Pulmonary:     Effort: Pulmonary effort is normal.      Breath sounds: Normal breath sounds. No wheezing or rales.           Assessment & Recommendations:   86 y/o [place] male with hypertension. asymptomatic thoracic aorta dilatation and grade II AI.  Thoracic aorta dilatation, grade II AI: Largest dimension 4 sm on CTA in 06/2018.  Patient did not undergo recommended echocardiogram in 06/2020. Recommend CTA. Explained to the patient importance of being on antihypertensive medication, especially in ARB, given his ascending aortic aneurysm.  He understands the potential complication of rupture and death.  However, he absolutely refuses to be on antihypertensive medication.  Hypertension:  As above  F/u in 1 year   Nigel Mormon, MD [facility]. [place] Pager: [phone] Office: [phone] If no answer Cell [phone]

## 2020-07-25 NOTE — Progress Notes (Signed)
Discussed with the patient.

## 2020-07-25 NOTE — Progress Notes (Signed)
Please route to PCP

## 2021-03-11 ENCOUNTER — Other Ambulatory Visit: Payer: Self-pay

## 2021-03-11 ENCOUNTER — Encounter (HOSPITAL_COMMUNITY): Payer: Self-pay | Admitting: Emergency Medicine

## 2021-03-11 ENCOUNTER — Emergency Department (HOSPITAL_COMMUNITY): Payer: No Typology Code available for payment source

## 2021-03-11 ENCOUNTER — Emergency Department (HOSPITAL_COMMUNITY)
Admission: EM | Admit: 2021-03-11 | Discharge: 2021-03-11 | Disposition: A | Discharge status: Home / Self Care | Payer: No Typology Code available for payment source | Attending: Emergency Medicine | Admitting: Emergency Medicine

## 2021-03-11 DIAGNOSIS — I10 Essential (primary) hypertension: Secondary | ICD-10-CM | POA: Diagnosis not present

## 2021-03-11 DIAGNOSIS — Z7952 Long term (current) use of systemic steroids: Secondary | ICD-10-CM | POA: Insufficient documentation

## 2021-03-11 DIAGNOSIS — Z87891 Personal history of nicotine dependence: Secondary | ICD-10-CM | POA: Diagnosis not present

## 2021-03-11 DIAGNOSIS — Z91040 Latex allergy status: Secondary | ICD-10-CM | POA: Diagnosis not present

## 2021-03-11 DIAGNOSIS — J4531 Mild persistent asthma with (acute) exacerbation: Secondary | ICD-10-CM | POA: Insufficient documentation

## 2021-03-11 DIAGNOSIS — U071 COVID-19: Secondary | ICD-10-CM | POA: Diagnosis not present

## 2021-03-11 DIAGNOSIS — R059 Cough, unspecified: Secondary | ICD-10-CM | POA: Diagnosis present

## 2021-03-11 MED ORDER — HYDROCOD POLST-CPM POLST ER 10-8 MG/5ML PO SUER
5.0000 mL | Freq: Once | ORAL | Status: AC
  Administered 2021-03-11: 5 mL via ORAL
  Filled 2021-03-11: qty 5

## 2021-03-11 MED ORDER — PREDNISONE 20 MG PO TABS
60.0000 mg | ORAL_TABLET | Freq: Once | ORAL | Status: AC
  Administered 2021-03-11: 60 mg via ORAL
  Filled 2021-03-11: qty 3

## 2021-03-11 MED ORDER — PREDNISONE 20 MG PO TABS: ORAL_TABLET | ORAL | 0 refills | Status: DC

## 2021-03-11 MED ORDER — HYDROCODONE BIT-HOMATROP MBR 5-1.5 MG/5ML PO SOLN: 5.0000 mL | Freq: Four times a day (QID) | ORAL | 0 refills | Status: DC | PRN

## 2021-03-11 NOTE — ED Provider Notes (Signed)
Emergency Medicine Provider Triage Evaluation Note  Stephen Hawkins , a 87 y.o. male  was evaluated in triage.  Pt complains of persistent cough and wheezing.  Had COVID recently and is concerned that his cough is still persistent.  Symptoms will be intermittent with mucus.  No chest pain.  Has tried [employer] and home inhaler with only minimal improvement.  Review of Systems  Positive: Cough Negative: Chest pain, shortness of breath  Physical Exam  BP (!) 163/109 (BP Location: Left Arm)   Pulse 86   Temp 99 F (37.2 C) (Oral)   Resp (!) 22   Ht 6' (1.829 m)   Wt 98.8 kg   SpO2 95%   BMI 29.54 kg/m  Gen:   Awake, no distress   Resp:  Normal effort  MSK:   Moves extremities without difficulty  Other:  Normal heart rate  Medical Decision Making  Medically screening exam initiated at 8:50 PM.  Appropriate orders placed.  HASSELL PATRAS was informed that the remainder of the evaluation will be completed by another provider, this initial triage assessment does not replace that evaluation, and the importance of remaining in the ED until their evaluation is complete.  Chest x-ray   Dietrich Pates, Cordelia Poche 03/11/21 2052    Terrilee Files, MD 03/13/21 [id number]

## 2021-03-11 NOTE — ED Provider Notes (Signed)
[facility]-EMERGENCY DEPT Provider Note   CSN: [id number] Arrival date & time: 03/11/21  1944     History Chief Complaint  Patient presents with  . Cough    Stephen Hawkins is a 87 y.o. male.  Patient presents to the emergency department for evaluation of cough.  Patient reports that he has had a persistent cough for the last 2 or 3 days.  He tested positive for COVID 8 days ago.  Patient does have history of asthma.  He has been using his albuterol and it helps.  He did recently do a short burst of prednisone but is off of it.        Past Medical History:  Diagnosis Date  . Arthritis    knees, right foot, left hand  . Asthma   . Environmental allergies   . GERD (gastroesophageal reflux disease)   . Hypertension    no medicines.  MD watching.  . Pneumonia   . Shortness of breath dyspnea    with exertion  . Vertigo    distant past  . Wears dentures    upper full plate  . Wears hearing aid    bilateral    Patient Active Problem List   Diagnosis Date Noted  . Aortic root dilatation (HCC) 07/16/2019  . Nonrheumatic aortic valve insufficiency 07/16/2019  . GERD (gastroesophageal reflux disease) 06/28/2012  . Left shoulder pain 06/28/2012  . Chest pain 06/27/2012  . Asthma 06/27/2012  . Hypertension 06/27/2012  . Bradycardia 06/27/2012  . Hypokalemia 06/27/2012    Past Surgical History:  Procedure Laterality Date  . CATARACT EXTRACTION W/PHACO Right 03/10/2016   Procedure: CATARACT EXTRACTION PHACO AND INTRAOCULAR LENS PLACEMENT (IOC);  Surgeon: Lockie Mola, MD;  Location: [facility];  Service: Ophthalmology;  Laterality: Right;  LATEX allergy  . CATARACT EXTRACTION W/PHACO Left 06/02/2016   Procedure: CATARACT EXTRACTION PHACO AND INTRAOCULAR LENS PLACEMENT (IOC);  Surgeon: Lockie Mola, MD;  Location: [facility];  Service: Ophthalmology;  Laterality: Left;  . CYSTOSCOPY    . ESOPHAGOGASTRODUODENOSCOPY N/A  10/14/2016   Procedure: ESOPHAGOGASTRODUODENOSCOPY (EGD);  Surgeon: Jeani Hawking, MD;  Location: [facility];  Service: Endoscopy;  Laterality: N/A;  . HERNIA REPAIR    . KNEE ARTHROSCOPY     knee surgery in the army       Family History  Problem Relation Age of Onset  . Stroke Mother   . Hypertension Mother   . Heart attack Sister     Social History   Tobacco Use  . Smoking status: Former Smoker    Years: 8.00    Types: Pipe    Quit date: 1970    Years since quitting: 52.4  . Smokeless tobacco: Never Used  . Tobacco comment: quit over 40 yrs ago  Vaping Use  . Vaping Use: Never used  Substance Use Topics  . Alcohol use: No  . Drug use: No    Home Medications Prior to Admission medications   Medication Sig Start Date End Date Taking? Authorizing Provider  HYDROcodone bit-homatropine (HYCODAN) 5-1.5 MG/5ML syrup Take 5 mLs by mouth every 6 (six) hours as needed for cough. 03/11/21  Yes Wanell Lorenzi, Canary Brim, MD  predniSONE (DELTASONE) 20 MG tablet 3 tabs po daily x 3 days, then 2 tabs x 3 days, then 1.5 tabs x 3 days, then 1 tab x 3 days, then 0.5 tabs x 3 days 03/11/21  Yes Arriyanna Mersch, Canary Brim, MD  albuterol (PROVENTIL HFA;VENTOLIN HFA) 108 (90  BASE) MCG/ACT inhaler Inhale 2 puffs into the lungs every 6 (six) hours as needed for wheezing or shortness of breath.     [provider]  budesonide-formoterol (SYMBICORT) 80-4.5 MCG/ACT inhaler Inhale 2 puffs into the lungs 2 (two) times daily.    [provider]  diphenhydrAMINE (BENADRYL) 2 % cream Apply 1 application topically 3 (three) times daily as needed for itching.    [provider]  montelukast (SINGULAIR) 10 MG tablet Take 10 mg by mouth at bedtime.    [provider]  naproxen sodium (ALEVE) 220 MG tablet Take 220 mg by mouth daily as needed.    [provider]  triamcinolone cream (KENALOG) 0.1 % Apply 1 application topically 2 (two) times daily. 10/26/17   Lawyer,  Cristal Deer, PA-C    Allergies    Celery oil, Fish allergy, and Latex  Review of Systems   Review of Systems  Respiratory: Positive for cough.   All other systems reviewed and are negative.   Physical Exam Updated Vital Signs BP (!) 163/109 (BP Location: Left Arm)   Pulse 86   Temp 99 F (37.2 C) (Oral)   Resp (!) 22   Ht 6' (1.829 m)   Wt 98.8 kg   SpO2 95%   BMI 29.54 kg/m   Physical Exam Vitals and nursing note reviewed.  Constitutional:      General: He is not in acute distress.    Appearance: Normal appearance. He is well-developed.  HENT:     Head: Normocephalic and atraumatic.     Right Ear: Hearing normal.     Left Ear: Hearing normal.     Nose: Nose normal.  Eyes:     Conjunctiva/sclera: Conjunctivae normal.     Pupils: Pupils are equal, round, and reactive to light.  Cardiovascular:     Rate and Rhythm: Regular rhythm.     Heart sounds: S1 normal and S2 normal. No murmur heard. No friction rub. No gallop.   Pulmonary:     Effort: Pulmonary effort is normal. No respiratory distress.     Breath sounds: Normal breath sounds.  Chest:     Chest wall: No tenderness.  Abdominal:     General: Bowel sounds are normal.     Palpations: Abdomen is soft.     Tenderness: There is no abdominal tenderness. There is no guarding or rebound. Negative signs include Murphy's sign and McBurney's sign.     Hernia: No hernia is present.  Musculoskeletal:        General: Normal range of motion.     Cervical back: Normal range of motion and neck supple.  Skin:    General: Skin is warm and dry.     Findings: No rash.  Neurological:     Mental Status: He is alert and oriented to person, place, and time.     GCS: GCS eye subscore is 4. GCS verbal subscore is 5. GCS motor subscore is 6.     Cranial Nerves: No cranial nerve deficit.     Sensory: No sensory deficit.     Coordination: Coordination normal.  Psychiatric:        Speech: Speech normal.        Behavior: Behavior  normal.        Thought Content: Thought content normal.     ED Results / Procedures / Treatments   Labs (all labs ordered are listed, but only abnormal results are displayed) Labs Reviewed - No data to display  EKG None  Radiology DG Chest 2 View  Result Date: 03/11/2021 CLINICAL DATA:  Cough and short of breath EXAM: CHEST - 2 VIEW COMPARISON:  12/23/2018 FINDINGS: The heart size and mediastinal contours are within normal limits. Both lungs are clear. The visualized skeletal structures are unremarkable. IMPRESSION: No active cardiopulmonary disease. Electronically Signed   By: Marlan Palau M.D.   On: 03/11/2021 21:14    Procedures Procedures   Medications Ordered in ED Medications  chlorpheniramine-HYDROcodone (TUSSIONEX) 10-8 MG/5ML suspension 5 mL (has no administration in time range)  predniSONE (DELTASONE) tablet 60 mg (has no administration in time range)    ED Course  I have reviewed the triage vital signs and the nursing notes.  Pertinent labs & imaging results that were available during my care of the patient were reviewed by me and considered in my medical decision making (see chart for details).    MDM Rules/Calculators/A&P                          Patient appears well.  Lungs are clear on auscultation but he does have a persistent dry cough.  Chest x-ray is clear, no signs of pneumonitis.  Patient's oxygen saturations are normal.  He will not require hospitalization.  He tested positive for COVID 8 days ago but was symptomatic for 3 days before that, therefore no antivirals.  Symptoms currently are that of mild asthma.  Restart prednisone taper, continue albuterol and cough medicine.  Final Clinical Impression(s) / ED Diagnoses Final diagnoses:  Mild persistent asthma with exacerbation  COVID-19    Rx / DC Orders ED Discharge Orders         Ordered    HYDROcodone bit-homatropine (HYCODAN) 5-1.5 MG/5ML syrup  Every 6 hours PRN        03/11/21 2331     predniSONE (DELTASONE) 20 MG tablet        03/11/21 2331           Gilda Crease, MD 03/11/21 [phone]

## 2021-03-11 NOTE — ED Triage Notes (Signed)
Pt arriving with complaint of persistent cough. Pt reports he was recently diagnosed with covid and his cough has not improved. Pt has been taking Tessalon. Has hx of asthma

## 2021-03-20 IMAGING — CT CT ANGIO CHEST
2 series · 18 of 32 positions shown · IV contrast (APPLIED)
Comparison: Prior CT a chest 07/05/2018

CLINICAL DATA: Thoracic aortic aneurysm surveillance

EXAM:
CT ANGIOGRAPHY CHEST WITH CONTRAST
TECHNIQUE: Multidetector CT imaging of the chest was performed using the
standard protocol during bolus administration of intravenous
contrast. Multiplanar CT image reconstructions and MIPs were
obtained to evaluate the vascular anatomy.
CONTRAST:  75mL H92NQF-XML IOPAMIDOL (H92NQF-XML) INJECTION 76%

[Series 5: chest angio · axial · 0.98mm/px · z∈[-46,+233]mm · 12 of 111 slices shown]
[im 9/111  lung]
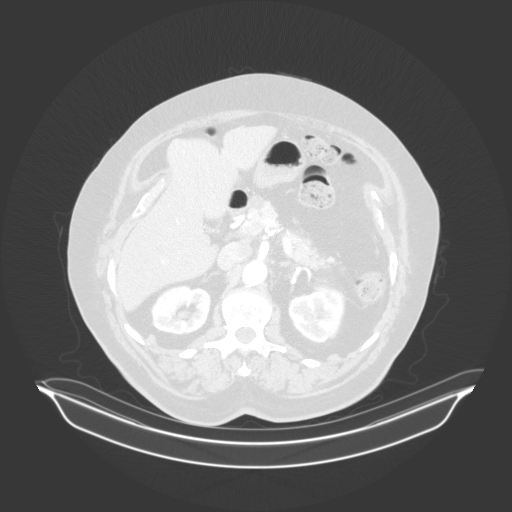
[im 17/111  soft-tissue]
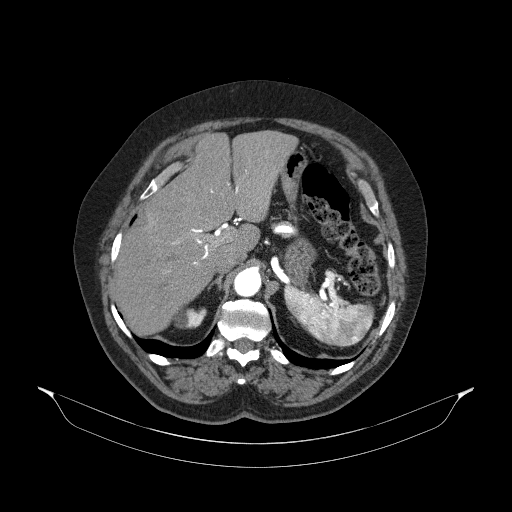
[im 26/111  lung]
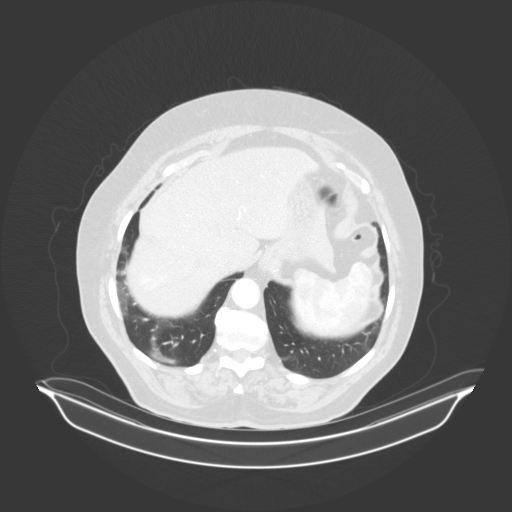
[im 34/111  soft-tissue]
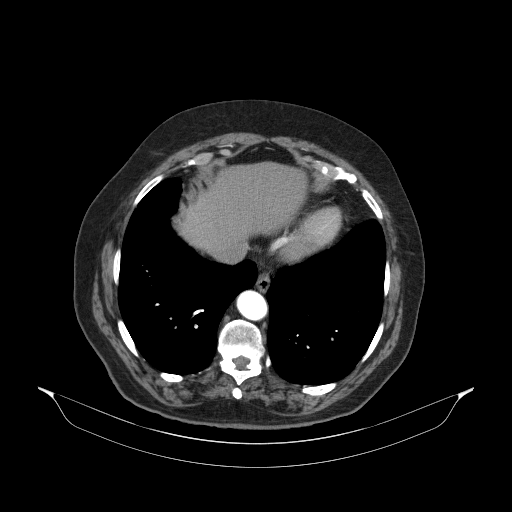
[im 43/111  lung]
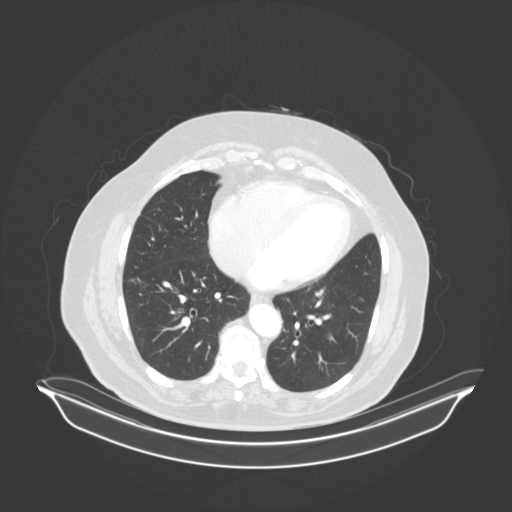
[im 51/111  soft-tissue]
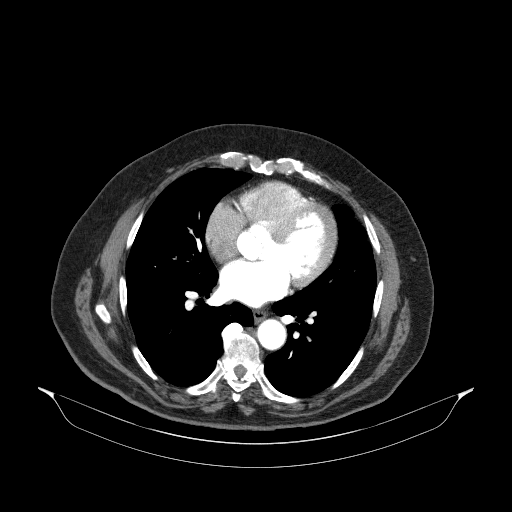
[im 60/111  lung]
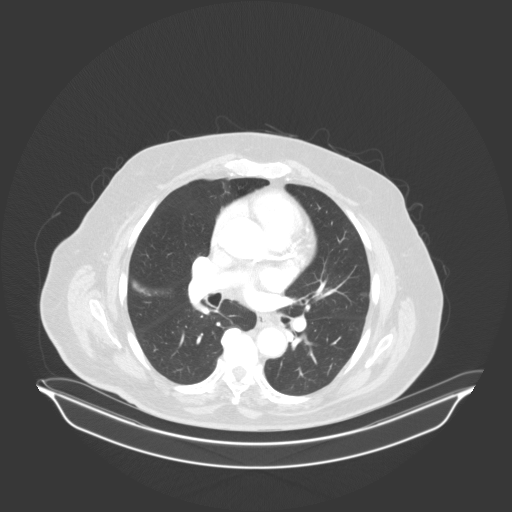
[im 68/111  soft-tissue]
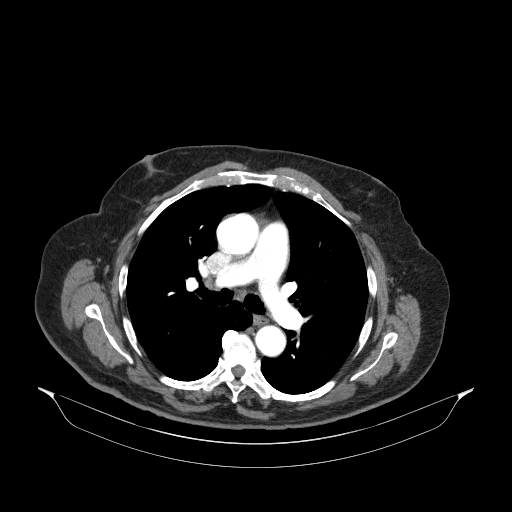
[im 77/111  lung]
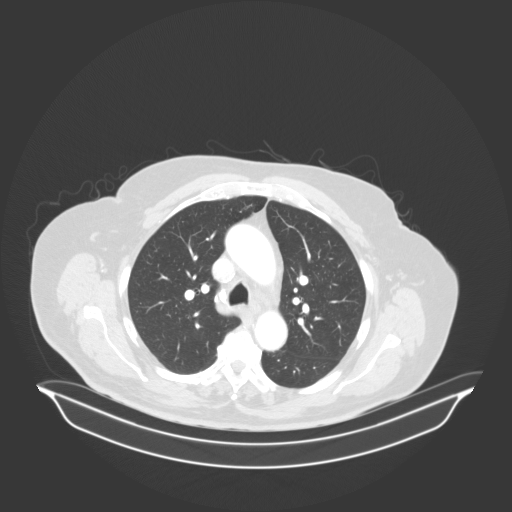
[im 85/111  soft-tissue]
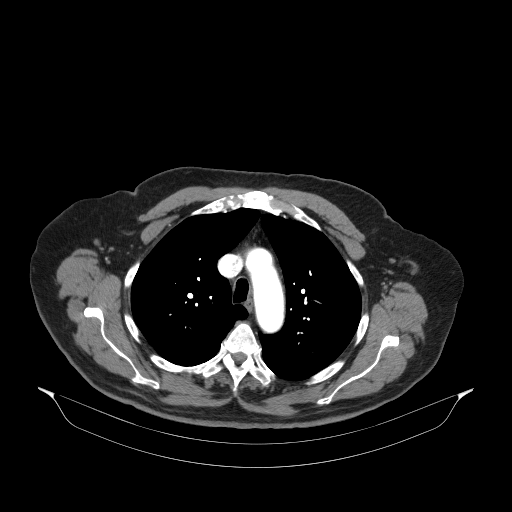
[im 94/111  lung]
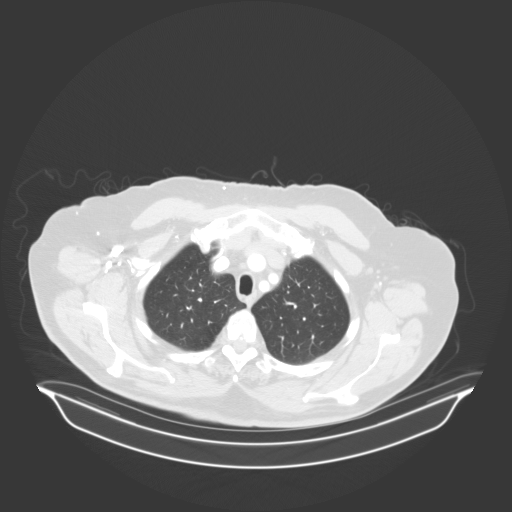
[im 102/111  soft-tissue]
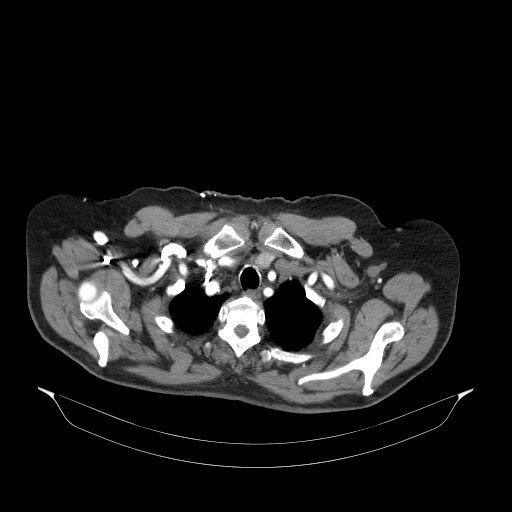

[Series 12: lung · axial · 0.98mm/px · z∈[-6,+170]mm · 6 of 151 slices shown]
[im 18/151  soft-tissue]
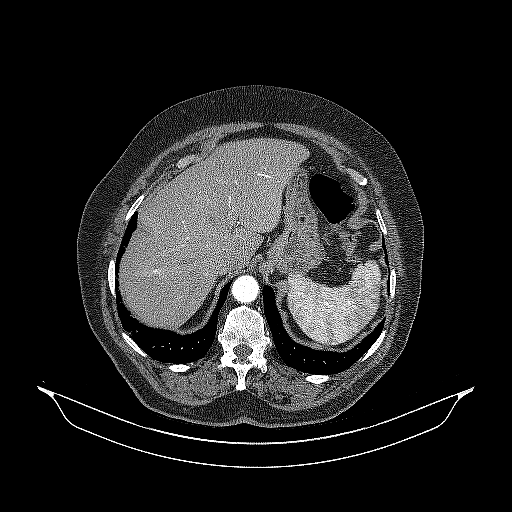
[im 36/151  soft-tissue]
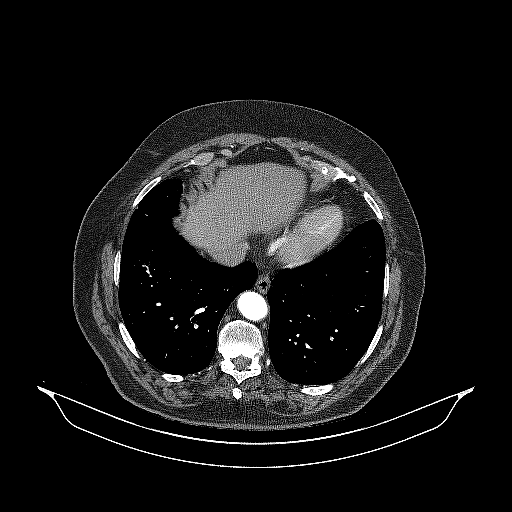
[im 53/151  soft-tissue]
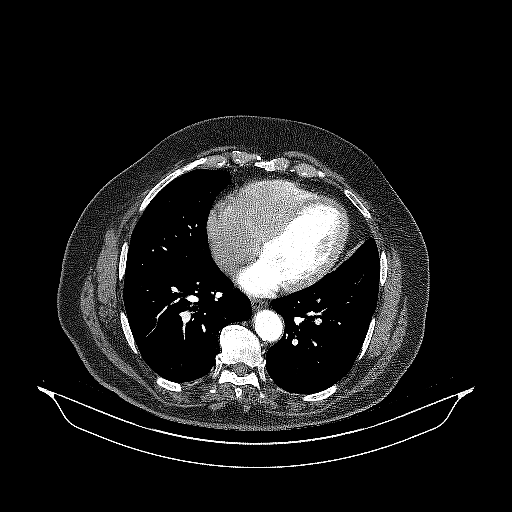
[im 71/151  soft-tissue]
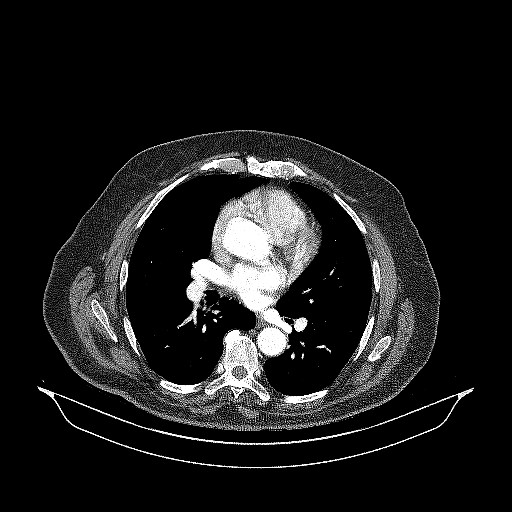
[im 89/151  soft-tissue]
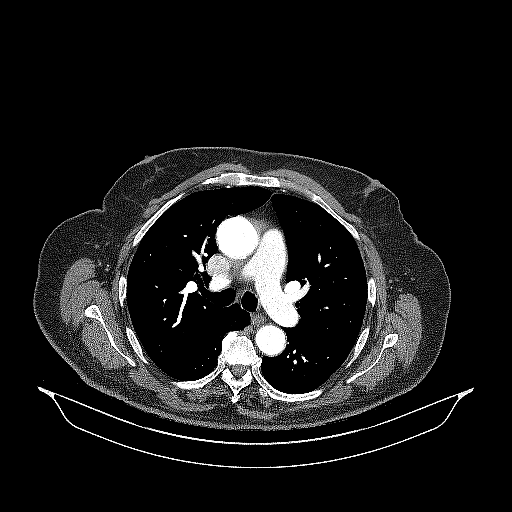
[im 106/151  soft-tissue]
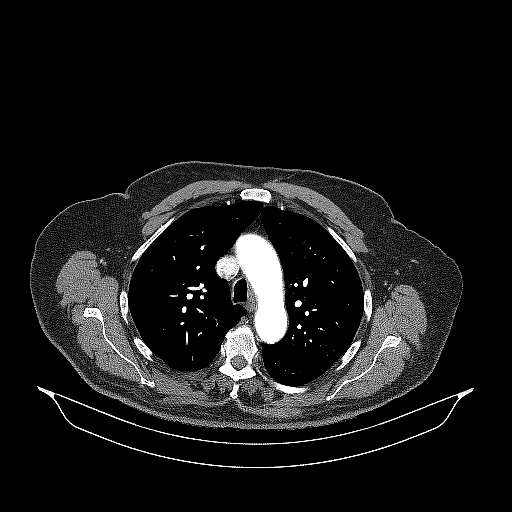

[18 of 32 positions shown; findings below may reference images not displayed]

FINDINGS: Cardiovascular: Mild fusiform aneurysmal dilation of the ascending
thoracic aorta with a maximal transverse diameter of 4 cm. No
significant interval enlargement compared to prior imaging. The
aortic root remains normal in caliber. No effacement of the
Becker junction. Two vessel aortic arch. The right
brachiocephalic and left common carotid artery share a common
origin. Trace atherosclerotic plaque in the aortic isthmus and
proximal descending thoracic aorta. Calcifications also visualized
along the courses of the coronary arteries. The heart is normal in
size. No pericardial effusion.

Mediastinum/Nodes: Unremarkable CT appearance of the thyroid gland.
No suspicious mediastinal or hilar adenopathy. No soft tissue
mediastinal mass. The thoracic esophagus is unremarkable.

Lungs/Pleura: Lungs are clear. No pleural effusion or pneumothorax.
Minimal linear atelectasis versus scarring in the lower lungs.

Upper Abdomen: No acute abnormality. Stable focal arteriovenous
shunting in the right hemi liver. This is a finding of no clinical
consequence. Stable simple renal cysts. Slight interval enlargement
of circumscribed low-attenuation lesion at the junction of the
pancreatic head and neck. The lesion measures 1.5 x 1.4 cm compared
to 1.3 by 1.1 cm previously.

Musculoskeletal: No chest wall abnormality. No acute or significant
osseous findings.

Review of the MIP images confirms the above findings.
IMPRESSION: 1. Stable mild fusiform aneurysmal dilation of the ascending
thoracic aorta with a maximal diameter of 4.0 cm. Recommend annual
imaging followup by CTA or MRA. This recommendation follows 0929
ACCF/AHA/AATS/ACR/ASA/SCA/ROBERT VALENTIN/HIOIENG/JOCELINA/TENZING Guidelines for the
Diagnosis and Management of Patients with Thoracic Aortic Disease.
Circulation. 0929; 121: E266-e369. Aortic aneurysm NOS
(FOJ8T-YTA.Y); Aortic Atherosclerosis (FOJ8T-CDY.Y).
2. Coronary artery calcifications.
3. Slight interval enlargement of a circumscribed low-attenuation
cystic lesion arising at the junction of the pancreatic head and
neck now measuring up to 1.5 cm in diameter compared to 1.3 cm in
9130. Given advanced age, further follow-up is optional. If
additional imaging surveillance is clinically desired, consider
pancreas protocol MRI of the abdomen in 6-12 months.
4. Additional ancillary findings as above.

## 2021-07-24 ENCOUNTER — Ambulatory Visit: Payer: Medicare Other | Admitting: Cardiology

## 2021-07-24 ENCOUNTER — Other Ambulatory Visit: Payer: Self-pay

## 2021-07-24 ENCOUNTER — Encounter: Payer: Self-pay | Admitting: Cardiology

## 2021-07-24 VITALS — BP 150/70 | HR 66 | Temp 98.0°F | Resp 16 | Ht 72.0 in | Wt 211.0 lb

## 2021-07-24 DIAGNOSIS — I351 Nonrheumatic aortic (valve) insufficiency: Secondary | ICD-10-CM

## 2021-07-24 DIAGNOSIS — I10 Essential (primary) hypertension: Secondary | ICD-10-CM

## 2021-07-24 DIAGNOSIS — I77810 Thoracic aortic ectasia: Secondary | ICD-10-CM

## 2021-07-24 NOTE — Progress Notes (Signed)
Follow up visit  Subjective:   Stephen Hawkins, male    DOB: 1931-04-19, 87 y.o.   MRN: [id number]   Chief Complaint  Patient presents with   Nonrheumatic aortic valve insufficiency   Hypertension   Follow-up    1 year    HPI  88 y/o [place] male with hypertension. asymptomatic thoracic aorta dilatation and grade II AI.  CTA in 07/2020 showed fusiform ascending aorta max dimension 4.0 cm. Blood pressure remainss elevated.  As before, he absolutely refuses to be on any antihypertensive medications.    Current Outpatient Medications on File Prior to Visit  Medication Sig Dispense Refill   albuterol (PROVENTIL HFA;VENTOLIN HFA) 108 (90 BASE) MCG/ACT inhaler Inhale 2 puffs into the lungs every 6 (six) hours as needed for wheezing or shortness of breath.      budesonide-formoterol (SYMBICORT) 80-4.5 MCG/ACT inhaler Inhale 2 puffs into the lungs 2 (two) times daily.     montelukast (SINGULAIR) 10 MG tablet Take 10 mg by mouth at bedtime.     naproxen sodium (ALEVE) 220 MG tablet Take 220 mg by mouth daily as needed.     No current facility-administered medications on file prior to visit.    Cardiovascular studies:  EKG 07/24/2021: Sinus rhythm 63 bpm  CTA chest 07/24/2020: 1. Stable mild fusiform aneurysmal dilation of the ascending thoracic aorta with a maximal diameter of 4.0 cm. Recommend annual imaging followup by CTA or MRA. This recommendation follows 2010 ACCF/AHA/AATS/ACR/ASA/SCA/SCAI/SIR/STS/SVM Guidelines for the Diagnosis and Management of Patients with Thoracic Aortic Disease. Circulation. 2010; 121: X646-O032. Aortic aneurysm NOS (ICD10-I71.9); Aortic Atherosclerosis (ICD10-I70.0). 2. Coronary artery calcifications. 3. Slight interval enlargement of a circumscribed low-attenuation cystic lesion arising at the junction of the pancreatic head and neck now measuring up to 1.5 cm in diameter compared to 1.3 cm in 2019. Given advanced age, further  follow-up is optional. If additional imaging surveillance is clinically desired, consider pancreas protocol MRI of the abdomen in 6-12 months. 4. Additional ancillary findings as above.  Echocardiogram 06/26/2019: Left ventricle cavity is normal in size. Moderate concentric hypertrophy of the left ventricle. Normal LV systolic function with EF 56%. Normal global wall motion. Normal diastolic filling pattern.  Trileaflet aortic valve. Mild aortic valve leaflet calcification. Moderate (Grade II) aortic regurgitation. Aortic root measures 3.5 cm. Mild (Grade I) mitral regurgitation. Mild tricuspid regurgitation. Estimated pulmonary artery systolic pressure is 22 mmHg. No significant change compared to previous study on 06/23/2018.  CTA Chest 07/05/18: Bovine arch Aorta measurements 4 cm at Sinus of valsalva and mid ascending aorta 3.9 cm at proximal arch. 3.1 cm at STJ 3 cm distal arch 3.2 cm proximal descending aorta 2.6 cm distal descending aorta  Echocardiogram 06/23/2018: Left ventricle cavity is normal in size. Doppler evidence of grade I (impaired) diastolic dysfunction. Calculated EF 77%. Left atrial cavity is mildly dilated. Trileaflet aortic valve with moderate (Grade II) regurgitation. The aortic root is dilated, measuring 3.8 cm at sinus of Valsalva and 4.5 in proximal ascending aorta.  Inadequate TR jet to estimate PASP. Normal right atrial pressure.  Recommend follow up echocardiogram in one year.  Recent labs: 06/06/2018: Glucose 89.  BUN/creatinine 20/1.15.  EGFR 58.  Sodium 145, potassium 4.5 H/H 12/37.  MCV 89.  Platelets 175. TSH 1.7 normal   Review of Systems  Cardiovascular:  Negative for chest pain, dyspnea on exertion, leg swelling, palpitations and syncope.        Vitals:   07/24/21 1210 07/24/21 1217  BP: Marland Kitchen)  160/75 (!) 150/70  Pulse: 64 66  Resp: 16   Temp: 98 F (36.7 C)   SpO2: 95%      Body mass index is 28.62 kg/m. Filed Weights    07/24/21 1210  Weight: 211 lb (95.7 kg)     Objective:   Physical Exam Vitals and nursing note reviewed.  Constitutional:      General: He is not in acute distress. Neck:     Vascular: No JVD.  Cardiovascular:     Rate and Rhythm: Normal rate and regular rhythm.     Heart sounds: Normal heart sounds. No murmur heard. Pulmonary:     Effort: Pulmonary effort is normal.     Breath sounds: Normal breath sounds. No wheezing or rales.          Assessment & Recommendations:   87 y/o [place] American male with hypertension. asymptomatic thoracic aorta dilatation and grade II AI.  Thoracic aorta dilatation, grade II AI: Largest dimension 4 sm on CTA in 07/2020. Will check echocardiogram now. As before, again explained to the patient importance of being on antihypertensive medication, especially in ARB, given his ascending aortic aneurysm.  He understands the potential complication of rupture and death.  However, he absolutely refuses to be on antihypertensive medication.  Hypertension:  As above  F/u in 1 year   Nigel Mormon, MD [facility] Pager: [phone] Office: [phone] If no answer Cell [phone]

## 2021-08-12 ENCOUNTER — Other Ambulatory Visit: Payer: Self-pay

## 2021-08-12 ENCOUNTER — Ambulatory Visit: Payer: Medicare Other

## 2021-08-12 DIAGNOSIS — I10 Essential (primary) hypertension: Secondary | ICD-10-CM

## 2021-08-12 DIAGNOSIS — I77810 Thoracic aortic ectasia: Secondary | ICD-10-CM

## 2022-07-23 ENCOUNTER — Encounter: Payer: Self-pay | Admitting: Cardiology

## 2022-07-23 ENCOUNTER — Ambulatory Visit: Payer: Medicare PPO | Admitting: Cardiology

## 2022-07-23 VITALS — BP 145/69 | HR 61 | Temp 98.0°F | Resp 16 | Ht 72.0 in | Wt 210.0 lb

## 2022-07-23 DIAGNOSIS — I351 Nonrheumatic aortic (valve) insufficiency: Secondary | ICD-10-CM

## 2022-07-23 DIAGNOSIS — I77810 Thoracic aortic ectasia: Secondary | ICD-10-CM

## 2022-07-23 DIAGNOSIS — I10 Essential (primary) hypertension: Secondary | ICD-10-CM

## 2022-07-23 NOTE — Progress Notes (Signed)
Follow up visit  Subjective:   Stephen Hawkins, male    DOB: 07-06-1931, 88 y.o.   MRN: [id number]   Chief Complaint  Patient presents with   Hypertension   Follow-up    1 year   TAA    HPI  88 y/o [place] male with hypertension. asymptomatic thoracic aorta dilatation and grade II AI.  Patient recently underwent spinal surgery with improvement in his back pain and radiculopathy symptoms. He has made significant changes to his diet, eating low carb, heart healthy diet.     Current Outpatient Medications:    albuterol (PROVENTIL HFA;VENTOLIN HFA) 108 (90 BASE) MCG/ACT inhaler, Inhale 2 puffs into the lungs every 6 (six) hours as needed for wheezing or shortness of breath. , Disp: , Rfl:    budesonide-formoterol (SYMBICORT) 80-4.5 MCG/ACT inhaler, Inhale 2 puffs into the lungs 2 (two) times daily., Disp: , Rfl:    gabapentin (NEURONTIN) 100 MG capsule, Take by mouth., Disp: , Rfl:    montelukast (SINGULAIR) 10 MG tablet, Take 10 mg by mouth at bedtime., Disp: , Rfl:    naproxen sodium (ALEVE) 220 MG tablet, Take 220 mg by mouth daily as needed., Disp: , Rfl:     Cardiovascular studies:  EKG 07/23/2022: Sinus rhythm 61 bpm Nonspecific T-abnormality  Echocardiogram 08/12/2021:  Normal LV systolic function with EF 56%. Mild concentric hypertrophy of  the left ventricle. Normal global wall motion. Left ventricle cavity is  normal in size. Doppler evidence of grade I (impaired) diastolic  dysfunction, normal LAP.  Trileaflet aortic valve. Moderate aortic valve leaflet calcification. No  evidence of aortic stenosis. Moderate (Grade II) aortic regurgitation.  Mild mitral valve leaflet calcification. No evidence of mitral stenosis.  Mild (Grade I) mitral regurgitation.  Mild tricuspid regurgitation. Estimated pulmonary artery systolic pressure  29 mmHg.  Aortic root dilatation not appreciated on this study.  No significant change compared to previous study on  06/26/2019.  CTA chest 07/24/2020: 1. Stable mild fusiform aneurysmal dilation of the ascending thoracic aorta with a maximal diameter of 4.0 cm. Recommend annual imaging followup by CTA or MRA. This recommendation follows 2010 ACCF/AHA/AATS/ACR/ASA/SCA/SCAI/SIR/STS/SVM Guidelines for the Diagnosis and Management of Patients with Thoracic Aortic Disease. Circulation. 2010; 121: G549-I264. Aortic aneurysm NOS (ICD10-I71.9); Aortic Atherosclerosis (ICD10-I70.0). 2. Coronary artery calcifications. 3. Slight interval enlargement of a circumscribed low-attenuation cystic lesion arising at the junction of the pancreatic head and neck now measuring up to 1.5 cm in diameter compared to 1.3 cm in 2019. Given advanced age, further follow-up is optional. If additional imaging surveillance is clinically desired, consider pancreas protocol MRI of the abdomen in 6-12 months. 4. Additional ancillary findings as above.  Recent labs: 02/10/2022: Glucose 88, BUN/Cr 16/0.98. EGFR 74. Na/K 144/4.1. AlKP 40. Rest of the CMP normal H/H 12/35. MCV 87. Platelets 248 Chol 191, TG 92, HDL 43, LDL 131   Review of Systems  Cardiovascular:  Negative for chest pain, dyspnea on exertion, leg swelling, palpitations and syncope.         Vitals:   07/23/22 1131 07/23/22 1138  BP: (!) 156/71 (!) 145/69  Pulse: 64 61  Resp: 16   Temp: 98 F (36.7 C)   SpO2: 97%      Body mass index is 28.48 kg/m. Filed Weights   07/23/22 1131  Weight: 210 lb (95.3 kg)     Objective:   Physical Exam Vitals and nursing note reviewed.  Constitutional:      General: He is not  in acute distress. Neck:     Vascular: No JVD.  Cardiovascular:     Rate and Rhythm: Normal rate and regular rhythm.     Heart sounds: Normal heart sounds. No murmur heard. Pulmonary:     Effort: Pulmonary effort is normal.     Breath sounds: Normal breath sounds. No wheezing or rales.           Assessment & Recommendations:   88  y/o [place] male with hypertension. asymptomatic thoracic aorta dilatation and grade II AI.  Thoracic aorta dilatation, grade II AI: Largest dimension 4 sm on CTA in 07/2020. Will repeat CTA and echocardiogram. As before, again explained to the patient importance of being on antihypertensive medication, especially in ARB, given his ascending aortic aneurysm.  He understands the potential complication of rupture and death.  However, he absolutely refuses to be on antihypertensive medication.  Hypertension:  As above  Hyperlipidemia: Has made dietary changes. Wants to avoid medications.   F/u in 1 year    Nigel Mormon, MD Pager: [phone] Office: [phone]

## 2022-08-18 ENCOUNTER — Ambulatory Visit: Payer: Medicare PPO

## 2022-08-18 DIAGNOSIS — I351 Nonrheumatic aortic (valve) insufficiency: Secondary | ICD-10-CM

## 2022-08-25 ENCOUNTER — Encounter: Payer: Self-pay | Admitting: Cardiology

## 2022-08-26 ENCOUNTER — Ambulatory Visit
Admission: RE | Admit: 2022-08-26 | Discharge: 2022-08-26 | Disposition: A | Discharge status: Home / Self Care | Payer: Medicare PPO | Source: Ambulatory Visit | Attending: Cardiology | Admitting: Cardiology

## 2022-08-26 DIAGNOSIS — I77810 Thoracic aortic ectasia: Secondary | ICD-10-CM

## 2022-08-26 MED ORDER — IOPAMIDOL (ISOVUE-370) INJECTION 76%
75.0000 mL | Freq: Once | INTRAVENOUS | Status: AC | PRN
  Administered 2022-08-26: 75 mL via INTRAVENOUS

## 2022-08-26 NOTE — Progress Notes (Signed)
Discussed the results with patient and his wife.  Given his age and overall historical stability, I think we can discontinue further serial monitoring of his aorta sizes. Discussed the pancreatic head finding. I offered further referral to GI or cancer center. They want to think about it and get back to me.  Elder Negus, MD

## 2023-07-27 ENCOUNTER — Ambulatory Visit: Payer: Self-pay | Admitting: Cardiology

## 2023-08-01 ENCOUNTER — Encounter: Payer: Self-pay | Admitting: Cardiology

## 2023-08-01 ENCOUNTER — Ambulatory Visit: Payer: Medicare PPO | Attending: Cardiology | Admitting: Cardiology

## 2023-08-01 VITALS — BP 178/70 | HR 67 | Resp 16 | Ht 72.0 in | Wt 225.8 lb

## 2023-08-01 DIAGNOSIS — R0609 Other forms of dyspnea: Secondary | ICD-10-CM | POA: Insufficient documentation

## 2023-08-01 DIAGNOSIS — I351 Nonrheumatic aortic (valve) insufficiency: Secondary | ICD-10-CM

## 2023-08-01 DIAGNOSIS — K869 Disease of pancreas, unspecified: Secondary | ICD-10-CM | POA: Insufficient documentation

## 2023-08-01 DIAGNOSIS — I77810 Thoracic aortic ectasia: Secondary | ICD-10-CM | POA: Diagnosis not present

## 2023-08-01 NOTE — Patient Instructions (Signed)
Medication Instructions:   Your physician recommends that you continue on your current medications as directed. Please refer to the Current Medication list given to you today.  *If you need a refill on your cardiac medications before your next appointment, please call your pharmacy*   You have been referred to SEE A GASTROENTEROLOGIST FOR PANCREATIC LESION    Testing/Procedures:  Your physician has requested that you have an echocardiogram. Echocardiography is a painless test that uses sound waves to create images of your heart. It provides your doctor with information about the size and shape of your heart and how well your heart's chambers and valves are working. This procedure takes approximately one hour. There are no restrictions for this procedure. Please do NOT wear cologne, perfume, aftershave, or lotions (deodorant is allowed). Please arrive 15 minutes prior to your appointment time.    Follow-Up:  4 WEEKS WITH AN EXTENDER IN THE OFFICE (FOR BLOOD PRESSURE RECHECK)

## 2023-08-01 NOTE — Progress Notes (Signed)
Cardiology Office Note:  .   Date:  08/01/2023  ID:  Stephen Hawkins, DOB [birth year], MRN [id number] PCP: Porfirio Oar, PA  [facility] Providers Cardiologist:  Truett Mainland, MD PCP: Porfirio Oar, PA  Chief Complaint  Patient presents with   Nonrheumatic aortic valve insufficiency      History of Present Illness: .    Stephen Hawkins is a 89 y.o. male with hypertension. asymptomatic thoracic aorta dilatation and grade II AI.   Given his age and overall historical stability, we discontinued further serial monitoring of his aorta sizes in 08/2022.  Given incidental finding of pancreatic head lesion, I offered him referral to GI, but patient had not made a decision regarding this.  Today, patient is here today with his wife. Recently, he has noticed increased exertional dyspnea and mild leg edema. He denies chest pain, presyncope or syncope, orthopnea or PND. Blood pressure is elevated today. He remains reluctant to take any medications at this time. Unfortunately, hs first wife had certain medication side effects, states that she was on "17 medications", before she passed. He states this as his rational behind not wanting to be on any medications.    Vitals:   08/01/23 1424 08/01/23 1436  BP: (!) 180/94 (!) 178/70  Pulse: 65 67  Resp: 16   SpO2: 95%      ROS:  Review of Systems  Cardiovascular:  Positive for dyspnea on exertion and leg swelling. Negative for chest pain, palpitations and syncope.     Studies Reviewed: Marland Kitchen        EKG 08/01/2023: Normal sinus rhythm Nonspecific T wave abnormality When compared with ECG of 23-Dec-2018 18:44, No significant change since   CTA chest 08/2022: Grossly stable 4 cm ascending thoracic aortic aneurysm. Recommend annual imaging followup by CTA or MRA. This recommendation follows 2010 ACCF/AHA/AATS/ACR/ASA/SCA/SCAI/SIR/STS/SVM Guidelines for the Diagnosis and Management of Patients with Thoracic Aortic  Disease. Circulation. 2010; 121: W098-J191. Aortic aneurysm NOS (ICD10-I71.9).   Mild coronary artery calcifications are noted.   17 mm rounded low density is seen arising anteriorly from pancreatic head which is slightly enlarged compared to prior exam. Most likely a pseudocyst. Indolent neoplasm, such as intraductal papillary mucinous tumor could look similar. Per consensus criteria, follow-up with preferably pre and post contrast abdominal MRI at 1 year is recommended. This recommendation follows ACR consensus guidelines: Managing Incidental Findings on Abdominal CT: White Paper of the ACR Incidental Findings Committee. J Am Coll Radiol 2010;7:754-773.   Aortic Atherosclerosis (ICD10-I70.0).  Echocardiogram 08/2022: Normal LV systolic function with visual EF 55-60%. Left ventricle cavity  is normal in size. Normal left ventricular wall thickness. Normal global  wall motion. Doppler evidence of grade I (impaired) diastolic dysfunction,  normal LAP. Calculated EF 55%.  Trileaflet aortic valve.  Moderate (Grade II) aortic regurgitation.  Moderate aortic valve leaflet calcification.  Mild (Grade I) mitral regurgitation. Mild mitral valve leaflet thickening.  Structurally normal tricuspid valve.  Mild tricuspid regurgitation. No  evidence of pulmonary hypertension.  The aortic root is normal. Mildly dilated ascending aorta.  No significant change compared to 08/2021.      Physical Exam:   Physical Exam Vitals and nursing note reviewed.  Constitutional:      General: He is not in acute distress. Neck:     Vascular: No JVD.  Cardiovascular:     Rate and Rhythm: Normal rate and regular rhythm.     Heart sounds: Normal heart sounds. No murmur heard. Pulmonary:  Effort: Pulmonary effort is normal.     Breath sounds: Normal breath sounds. No wheezing or rales.  Musculoskeletal:     Right lower leg: Edema (1+) present.     Left lower leg: Edema (1+) present.      VISIT  DIAGNOSES:   ICD-10-CM   1. Nonrheumatic aortic valve insufficiency  I35.1     2. Aortic root dilatation (HCC)  I77.810        ASSESSMENT AND PLAN: .    Stephen Hawkins is a 89 y.o. male with hypertension. asymptomatic thoracic aorta dilatation and grade II AI.   Dyspnea on exertion, leg edema: Patient is quite hypertensive today. Differentials include uncontrolled hypertension, heart failure, worsening AI. Will check echocardiogram today. Unfortunately, he remains quite reluctant to start any medications today.   Pancreatic lesion:  Incidental finding on CTA chest in 08/2022. Previously was not sure about GI referral. He is interested in GI consultation now. I will defer any further CT imaging decision to GI.   Aortic root dilatation: Given his age and overall historical stability, we discontinued further serial monitoring of his aorta sizes in 08/2022.  F/u in 4 weeks to discuss test results and reassess blood pressure.   Signed, Elder Negus, MD

## 2023-08-29 ENCOUNTER — Ambulatory Visit (HOSPITAL_COMMUNITY): Payer: Medicare PPO | Attending: Cardiovascular Disease

## 2023-08-29 DIAGNOSIS — R0609 Other forms of dyspnea: Secondary | ICD-10-CM | POA: Insufficient documentation

## 2023-08-29 LAB — ECHOCARDIOGRAM COMPLETE
AR max vel: 1.93 cm2
AV Area VTI: 1.91 cm2
AV Area mean vel: 1.83 cm2
AV Mean grad: 9.4 mm[Hg]
AV Peak grad: 16.9 mm[Hg]
Ao pk vel: 2.06 m/s
Area-P 1/2: 3.75 cm2
P 1/2 time: 513 ms
S' Lateral: 3.1 cm

## 2023-08-29 NOTE — Progress Notes (Signed)
Heart complete which was normal.  Relaxation is mildly abnormal but mild increase in pressure to the right side of the heart.  There is mild to moderate leakiness of 2 valves.  Aorta size is stable.  Overall, mild abnormal relaxation could likely be the cause of shortness of breath.  You may need medications to improve blood pressure and reduce excess fluid buildup. Please keep appt next week with APP as scheduled.  Thanks MJP

## 2023-09-01 NOTE — Progress Notes (Signed)
Cardiology Office Note:  .   Date:  09/05/2023  ID:  Gertie Fey, DOB 1931/06/10, MRN [id number] PCP: Porfirio Oar, PA  [facility] Providers Cardiologist:  None    Patient Profile: .      PMH Nonrheumatic aortic valve insufficiency Echo 08/29/2023 Mild to moderate aortic valve insufficiency, EF 60-65, G1DD, mildly elevated PASP, mild to moderate TR, mild MR Echo 08/19/2022 Moderate (Grade II) AI, mild MR, mild TR, EF 55-60, G1DD, no evidence of pulmonary hypertension, no significant change from 08/2021 Hypertension Coronary artery calcification noted on CT Aortic atherosclerosis Thoracic aortic dilatation CTA 08/2022 Grossly stable 4 cm ascending thoracic aortic aneurysm  He established with Dr. Rosemary Holms 07/2019 for thoracic aortic dilatation and aortic valve insufficiency noted on echocardiogram.  BP was elevated.  He was reluctant to start medical therapy.  He was encouraged to follow a low-sodium diet and continue regular follow-up with PCP.  Last cardiology clinic visit was 08/01/2023 with Dr. Rosemary Holms. There was an incidental finding of pancreatic head lesion on CT 08/2022. He initially declined referral to GI but did agree to referral at this ov.  He had noticed increased exertional dyspnea and leg swelling.  BP was elevated.  He continued to be reluctant to take any medications.  Unfortunately his first wife was on "17 medications" before she passed which is his rational for not starting anti-hypertensive therapy.  Surveillance echo was completed 08/29/2023 which revealed normal LVEF, G1 DD, mild to moderate leakiness of tricuspid valve, aortic valve, and mitral valve.  Aortic dilatation was stable at 40 mm. He was advised to follow-up with APP for symptom and BP management.       History of Present Illness: .   PACEY KOVAC is a very pleasant 89 y.o. male who is here today for follow-up of elevated BP and is accompanied by his wife. He reports experiencing  shortness of breath and bilateral LE edema. He wears a brace on his right knee. DOE has been persistent for some time and does not worsen with exertion. He is active around his home and often uses a walking stick. He has not been monitoring BP at home because he does not have a cuff. He expresses reluctance about starting medication due to fear of side effects and because his former wife was on multiple medications.  He denies chest pain, orthopnea, PND, abdominal distention, early satiety, presyncope, or syncope.  Discussed the use of AI scribe software for clinical note transcription with the patient, who gave verbal consent to proceed.   ROS: See HPI       Studies Reviewed: .        Risk Assessment/Calculations:     HYPERTENSION CONTROL Vitals:   09/05/23 0824 09/05/23 0848  BP: (!) 180/80 (!) 184/74    The patient's blood pressure is elevated above target today.  In order to address the patient's elevated BP: A new medication was prescribed today.          Physical Exam:   VS:  BP (!) 184/74   Pulse 61   Ht 6' (1.829 m)   Wt 223 lb 9.6 oz (101.4 kg)   SpO2 98%   BMI 30.33 kg/m    Wt Readings from Last 3 Encounters:  09/05/23 223 lb 9.6 oz (101.4 kg)  08/01/23 225 lb 12.8 oz (102.4 kg)  07/23/22 210 lb (95.3 kg)    GEN: Well nourished, well developed in no acute distress NECK: No JVD; No carotid bruits  CARDIAC: RRR, soft systolic murmur, no rubs, gallops RESPIRATORY:  Clear to auscultation without rales, wheezing or rhonchi  ABDOMEN: Soft, non-tender, non-distended EXTREMITIES:  Bilateral leg edema, non-pitting, R>L; No deformity     ASSESSMENT AND PLAN: .    Hypertension: BP remains significantly elevated during clinic visit today. He has not been monitoring at home. Lengthy discussion about the risks of untreated hypertension. He initially was concerned about being on multiple medications. We reviewed potential treatment options and he agrees to start  hydrochlorothiazide 25 mg daily which may provide additional benefit to decrease leg swelling.  We will also add potassium supplement.  He will return next week for BMP. Encouraged him to purchase home BP cuff and send in readings in 2 weeks.  Goal BP < 130/80. DASH diet encouraged.   Nonrheumatic valve disease: Soft systolic murmur most audible right upper sternal border on exam.  We reviewed findings from echocardiogram 08/29/23 including LVEF 60 to 65%, G1 DD, normal RV, mild mitral regurgitation, mild to moderate TR, mild calcification of aortic valve without any evidence of stenosis.  He has chronic DOE that does not worsen with exertion.  We will continue to monitor clinically at this time.  DOE: Reports intermittent DOE and chronic leg edema. He has LE edema, right > left but no additional signs of volume overload. He denies orthopnea, PND, chest pain.  We are initiating hydrochlorothiazide as noted above due to elevated BP in hopes that this will also provide benefit of diuresis.  Encouraged low-salt diet and leg elevation at rest.   Hyperlipidemia LDL goal < 70/Coronary calcification: Mild coronary artery calcification noted on CT 08/26/2022. Lipid panel 03/2023 with total cholesterol 186, triglycerides 87, HDL 55, and LDL 115. Briefly mentioned that cholesterol is above goal and statin therapy would be recommended. Would recommend further discussion at next office visit.  No symptoms concerning for angina.  No indication for further ischemia evaluation at this time.  Ascending aortic dilatation: Mild dilatation of the ascending aorta measuring 40 mm on echo 08/2023. Also noted on chest CT 08/26/2022 with recommendation for annual imaging.  He is asymptomatic. We will continue to follow.   Pancreatic lesion: Incidental finding of abnormality on pancreatic head on CT 08/2022. No acute concerns today. He has been referred to GI and is awaiting appointment.       Dispo: 3 months with Dr. Jackquline Denmark  or APP  Signed, Eligha Bridegroom, NP-C

## 2023-09-05 ENCOUNTER — Encounter: Payer: Self-pay | Admitting: Nurse Practitioner

## 2023-09-05 ENCOUNTER — Ambulatory Visit: Payer: Medicare PPO | Attending: Nurse Practitioner | Admitting: Nurse Practitioner

## 2023-09-05 VITALS — BP 184/74 | HR 61 | Ht 72.0 in | Wt 223.6 lb

## 2023-09-05 DIAGNOSIS — I77810 Thoracic aortic ectasia: Secondary | ICD-10-CM

## 2023-09-05 DIAGNOSIS — I7121 Aneurysm of the ascending aorta, without rupture: Secondary | ICD-10-CM | POA: Diagnosis not present

## 2023-09-05 DIAGNOSIS — R0609 Other forms of dyspnea: Secondary | ICD-10-CM | POA: Diagnosis not present

## 2023-09-05 DIAGNOSIS — K869 Disease of pancreas, unspecified: Secondary | ICD-10-CM

## 2023-09-05 DIAGNOSIS — E785 Hyperlipidemia, unspecified: Secondary | ICD-10-CM

## 2023-09-05 DIAGNOSIS — I351 Nonrheumatic aortic (valve) insufficiency: Secondary | ICD-10-CM

## 2023-09-05 DIAGNOSIS — I340 Nonrheumatic mitral (valve) insufficiency: Secondary | ICD-10-CM

## 2023-09-05 DIAGNOSIS — I10 Essential (primary) hypertension: Secondary | ICD-10-CM

## 2023-09-05 DIAGNOSIS — I361 Nonrheumatic tricuspid (valve) insufficiency: Secondary | ICD-10-CM

## 2023-09-05 MED ORDER — HYDROCHLOROTHIAZIDE 25 MG PO TABS: 25.0000 mg | ORAL_TABLET | Freq: Every day | ORAL | 6 refills | Status: AC

## 2023-09-05 MED ORDER — POTASSIUM CHLORIDE CRYS ER 10 MEQ PO TBCR: 10.0000 meq | EXTENDED_RELEASE_TABLET | Freq: Every day | ORAL | 6 refills | Status: AC

## 2023-09-05 NOTE — Patient Instructions (Signed)
Medication Instructions:   START hydrochlorothiazide one (1) tablet by mouth ( 25 mg) daily.  START K-dur one (1) tablet by mouth ( 10 mEq) daily.   *If you need a refill on your cardiac medications before your next appointment, please call your pharmacy*   Lab Work:  Your physician recommends that you return for lab work on Tuesday, December 3. You can come in anytime between 7:30 am -4:30 pm fasting from midnight the night before. Closed from 12:30-1:30 for lunch.   If you have labs (blood work) drawn today and your tests are completely normal, you will receive your results only by: MyChart Message (if you have MyChart) OR A paper copy in the mail If you have any lab test that is abnormal or we need to change your treatment, we will call you to review the results.   Testing/Procedures:  None ordered.   Follow-Up: At [facility], you and your health needs are our priority.  As part of our continuing mission to provide you with exceptional heart care, we have created designated Provider Care Teams.  These Care Teams include your primary Cardiologist (physician) and Advanced Practice Providers (APPs -  Physician Assistants and Nurse Practitioners) who all work together to provide you with the care you need, when you need it.  We recommend signing up for the patient portal called "MyChart".  Sign up information is provided on this After Visit Summary.  MyChart is used to connect with patients for Virtual Visits (Telemedicine).  Patients are able to view lab/test results, encounter notes, upcoming appointments, etc.  Non-urgent messages can be sent to your provider as well.   To learn more about what you can do with MyChart, go to [email or web address].    Your next appointment:   3 month(s)  Provider:   Dr. Rosemary Holms    Other Instructions  HOW TO TAKE YOUR BLOOD PRESSURE  Rest 5 minutes before taking your blood pressure. Don't  smoke or drink caffeinated beverages  for at least 30 minutes before. Take your blood pressure before (not after) you eat. Sit comfortably with your back supported and both feet on the floor ( don't cross your legs). Elevate your arm to heart level on a table or a desk. Use the proper sized cuff.  It should fit smoothly and snugly around your bare upper arm.  There should be  Enough room to slip a fingertip under the cuff.  The bottom edge of the cuff should be 1 inch above the crease Of the elbow. Please monitor your blood pressure once daily 2 hours after your am medication. If you blood pressure Consistently remains above 130 (systolic) top number or over 80 ( diastolic) bottom number X 3 days  Consecutively.  Please call our office at [phone] or send Mychart message.     ----Avoid cold medicines with D or DM at the end of them----     DASH Eating Plan DASH stands for Dietary Approaches to Stop Hypertension. The DASH eating plan is a healthy eating plan that has been shown to: Lower high blood pressure (hypertension). Reduce your risk for type 2 diabetes, heart disease, and stroke. Help with weight loss. What are tips for following this plan? Reading food labels Check food labels for the amount of salt (sodium) per serving. Choose foods with less than 5 percent of the Daily Value (DV) of sodium. In general, foods with less than 300 milligrams (mg) of sodium per serving fit into this eating plan. To  find whole grains, look for the word "whole" as the first word in the ingredient list. Shopping Buy products labeled as "low-sodium" or "no salt added." Buy fresh foods. Avoid canned foods and pre-made or frozen meals. Cooking Try not to add salt when you cook. Use salt-free seasonings or herbs instead of table salt or sea salt. Check with your health care provider or pharmacist before using salt substitutes. Do not fry foods. Cook foods in healthy ways, such as baking, boiling, grilling, roasting, or broiling. Cook using  oils that are good for your heart. These include olive, canola, avocado, soybean, and sunflower oil. Meal planning  Eat a balanced diet. This should include: 4 or more servings of fruits and 4 or more servings of vegetables each day. Try to fill half of your plate with fruits and vegetables. 6-8 servings of whole grains each day. 6 or less servings of lean meat, poultry, or fish each day. 1 oz is 1 serving. A 3 oz (85 g) serving of meat is about the same size as the palm of your hand. One egg is 1 oz (28 g). 2-3 servings of low-fat dairy each day. One serving is 1 cup (237 mL). 1 serving of nuts, seeds, or beans 5 times each week. 2-3 servings of heart-healthy fats. Healthy fats called omega-3 fatty acids are found in foods such as walnuts, flaxseeds, fortified milks, and eggs. These fats are also found in cold-water fish, such as sardines, salmon, and mackerel. Limit how much you eat of: Canned or prepackaged foods. Food that is high in trans fat, such as fried foods. Food that is high in saturated fat, such as fatty meat. Desserts and other sweets, sugary drinks, and other foods with added sugar. Full-fat dairy products. Do not salt foods before eating. Do not eat more than 4 egg yolks a week. Try to eat at least 2 vegetarian meals a week. Eat more home-cooked food and less restaurant, buffet, and fast food. Lifestyle When eating at a restaurant, ask if your food can be made with less salt or no salt. If you drink alcohol: Limit how much you have to: 0-1 drink a day if you are male. 0-2 drinks a day if you are male. Know how much alcohol is in your drink. In the [place] one drink is one 12 oz bottle of beer (355 mL), one 5 oz glass of wine (148 mL), or one 1 oz glass of hard liquor (44 mL). General information Avoid eating more than 2,300 mg of salt a day. If you have hypertension, you may need to reduce your sodium intake to 1,500 mg a day. Work with your provider to stay at a  healthy body weight or lose weight. Ask what the best weight range is for you. On most days of the week, get at least 30 minutes of exercise that causes your heart to beat faster. This may include walking, swimming, or biking. Work with your provider or dietitian to adjust your eating plan to meet your specific calorie needs. What foods should I eat? Fruits All fresh, dried, or frozen fruit. Canned fruits that are in their natural juice and do not have sugar added to them. Vegetables Fresh or frozen vegetables that are raw, steamed, roasted, or grilled. Low-sodium or reduced-sodium tomato and vegetable juice. Low-sodium or reduced-sodium tomato sauce and tomato paste. Low-sodium or reduced-sodium canned vegetables. Grains Whole-grain or whole-wheat bread. Whole-grain or whole-wheat pasta. Brown rice. Orpah Cobb. Bulgur. Whole-grain and low-sodium cereals. Pita bread.  Low-fat, low-sodium crackers. Whole-wheat flour tortillas. Meats and other proteins Skinless chicken or [place]. Ground chicken or [place]. Pork with fat trimmed off. Fish and seafood. Egg whites. Dried beans, peas, or lentils. Unsalted nuts, nut butters, and seeds. Unsalted canned beans. Lean cuts of beef with fat trimmed off. Low-sodium, lean precooked or cured meat, such as sausages or meat loaves. Dairy Low-fat (1%) or fat-free (skim) milk. Reduced-fat, low-fat, or fat-free cheeses. Nonfat, low-sodium ricotta or cottage cheese. Low-fat or nonfat yogurt. Low-fat, low-sodium cheese. Fats and oils Soft margarine without trans fats. Vegetable oil. Reduced-fat, low-fat, or light mayonnaise and salad dressings (reduced-sodium). Canola, safflower, olive, avocado, soybean, and sunflower oils. Avocado. Seasonings and condiments Herbs. Spices. Seasoning mixes without salt. Other foods Unsalted popcorn and pretzels. Fat-free sweets. The items listed above may not be all the foods and drinks you can have. Talk to a dietitian to learn  more. What foods should I avoid? Fruits Canned fruit in a light or heavy syrup. Fried fruit. Fruit in cream or butter sauce. Vegetables Creamed or fried vegetables. Vegetables in a cheese sauce. Regular canned vegetables that are not marked as low-sodium or reduced-sodium. Regular canned tomato sauce and paste that are not marked as low-sodium or reduced-sodium. Regular tomato and vegetable juices that are not marked as low-sodium or reduced-sodium. Rosita Fire. Olives. Grains Baked goods made with fat, such as croissants, muffins, or some breads. Dry pasta or rice meal packs. Meats and other proteins Fatty cuts of meat. Ribs. Fried meat. Tomasa Blase. Bologna, salami, and other precooked or cured meats, such as sausages or meat loaves, that are not lean and low in sodium. Fat from the back of a pig (fatback). Bratwurst. Salted nuts and seeds. Canned beans with added salt. Canned or smoked fish. Whole eggs or egg yolks. Chicken or [place] with skin. Dairy Whole or 2% milk, cream, and half-and-half. Whole or full-fat cream cheese. Whole-fat or sweetened yogurt. Full-fat cheese. Nondairy creamers. Whipped toppings. Processed cheese and cheese spreads. Fats and oils Butter. Stick margarine. Lard. Shortening. Ghee. Bacon fat. Tropical oils, such as coconut, palm kernel, or palm oil. Seasonings and condiments Onion salt, garlic salt, seasoned salt, table salt, and sea salt. Worcestershire sauce. Tartar sauce. Barbecue sauce. Teriyaki sauce. Soy sauce, including reduced-sodium soy sauce. Steak sauce. Canned and packaged gravies. Fish sauce. Oyster sauce. Cocktail sauce. Store-bought horseradish. Ketchup. Mustard. Meat flavorings and tenderizers. Bouillon cubes. Hot sauces. Pre-made or packaged marinades. Pre-made or packaged taco seasonings. Relishes. Regular salad dressings. Other foods Salted popcorn and pretzels. The items listed above may not be all the foods and drinks you should avoid. Talk to a dietitian to  learn more. Where to find more information National Heart, Lung, and Blood Institute (NHLBI): BuffaloDryCleaner.gl American Heart Association (AHA): heart.org Academy of Nutrition and Dietetics: eatright.org National Kidney Foundation (NKF): kidney.org This information is not intended to replace advice given to you by your health care provider. Make sure you discuss any questions you have with your health care provider. Document Revised: 10/14/2022 Document Reviewed: 10/14/2022 Elsevier Patient Education  2024 [employer].

## 2023-09-14 LAB — BASIC METABOLIC PANEL
BUN/Creatinine Ratio: 23 (ref 10–24)
BUN: 26 mg/dL (ref 10–36)
CO2: 23 mmol/L (ref 20–29)
Calcium: 9.6 mg/dL (ref 8.6–10.2)
Chloride: 100 mmol/L (ref 96–106)
Creatinine, Ser: 1.12 mg/dL (ref 0.76–1.27)
Glucose: 87 mg/dL (ref 70–99)
Potassium: 4.7 mmol/L (ref 3.5–5.2)
Sodium: 137 mmol/L (ref 134–144)
eGFR: 62 mL/min/{1.73_m2} (ref >59)

## 2023-10-19 ENCOUNTER — Encounter: Payer: Self-pay | Admitting: Gastroenterology

## 2023-11-01 ENCOUNTER — Ambulatory Visit: Payer: Medicare PPO | Admitting: Gastroenterology

## 2023-11-01 ENCOUNTER — Encounter: Payer: Self-pay | Admitting: Gastroenterology

## 2023-11-07 ENCOUNTER — Ambulatory Visit: Payer: Medicare PPO | Admitting: Gastroenterology

## 2023-12-01 ENCOUNTER — Ambulatory Visit: Payer: Medicare PPO | Admitting: Cardiology

## 2023-12-05 NOTE — Progress Notes (Unsigned)
 Chief Complaint:pancreatic lesion Primary GI Doctor: unassigned  HPI:  Patient is a  89  year old male patient with past medical history of arthritis, asthma, GERD, hypertension, who was referred to me by Elder Negus, MD on 08/01/23 for a pancreatic lesion .     On 08/01/23 cardiologist had ordered imaging to follow-up up on patients CTA with incidental finding of pancreatic head lesion. GI referral discussed at that time. Previously was not sure about GI referral. He is interested in GI consultation now.   Interval History  Patient admits/denies GERD Patient admits/denies dysphagia Patient admits/denies nausea, vomiting, or weight loss  Patient admits/denies altered bowel habits Patient admits/denies abdominal pain Patient admits/denies rectal bleeding   Denies/Admits alcohol Denies/Admits smoking Denies/Admits NSAID use. Denies/Admits they are on blood thinners.  Patients last colonoscopy Patients last EGD  Patient's family history includes  Wt Readings from Last 3 Encounters:  09/05/23 223 lb 9.6 oz (101.4 kg)  08/01/23 225 lb 12.8 oz (102.4 kg)  07/23/22 210 lb (95.3 kg)    Past Medical History:  Diagnosis Date   Arthritis    knees, right foot, left hand   Asthma    Environmental allergies    GERD (gastroesophageal reflux disease)    Hypertension    no medicines.  MD watching.   Pneumonia    Shortness of breath dyspnea    with exertion   Vertigo    distant past   Wears dentures    upper full plate   Wears hearing aid    bilateral    Past Surgical History:  Procedure Laterality Date   BACK SURGERY     CATARACT EXTRACTION W/PHACO Right 03/10/2016   Procedure: CATARACT EXTRACTION PHACO AND INTRAOCULAR LENS PLACEMENT (IOC);  Surgeon: Lockie Mola, MD;  Location: [facility];  Service: Ophthalmology;  Laterality: Right;  LATEX allergy   CATARACT EXTRACTION W/PHACO Left 06/02/2016   Procedure: CATARACT EXTRACTION PHACO AND  INTRAOCULAR LENS PLACEMENT (IOC);  Surgeon: Lockie Mola, MD;  Location: [facility];  Service: Ophthalmology;  Laterality: Left;   CYSTOSCOPY     ESOPHAGOGASTRODUODENOSCOPY N/A 10/14/2016   Procedure: ESOPHAGOGASTRODUODENOSCOPY (EGD);  Surgeon: Jeani Hawking, MD;  Location: [facility] END[facility];  Service: Endoscopy;  Laterality: N/A;   HERNIA REPAIR     KNEE ARTHROSCOPY     knee surgery in the [employer]    Current Outpatient Medications  Medication Sig Dispense Refill   albuterol (PROVENTIL HFA;VENTOLIN HFA) 108 (90 BASE) MCG/ACT inhaler Inhale 2 puffs into the lungs every 6 (six) hours as needed for wheezing or shortness of breath.      budesonide-formoterol (SYMBICORT) 80-4.5 MCG/ACT inhaler Inhale 2 puffs into the lungs 2 (two) times daily.     hydrochlorothiazide (HYDRODIURIL) 25 MG tablet Take 1 tablet (25 mg total) by mouth daily. 30 tablet 6   montelukast (SINGULAIR) 10 MG tablet Take 10 mg by mouth at bedtime.     potassium chloride (KLOR-CON M) 10 MEQ tablet Take 1 tablet (10 mEq total) by mouth daily. 30 tablet 6   No current facility-administered medications for this visit.    Allergies as of 12/06/2023 - Review Complete 09/05/2023  Allergen Reaction Noted   Celery oil Swelling 03/10/2016   Fish allergy  03/03/2016   Latex Rash 06/27/2012    Family History  Problem Relation Age of Onset   Stroke Mother    Hypertension Mother    Heart attack Sister     Review of Systems:    Constitutional:  No weight loss, fever, chills, weakness or fatigue HEENT: Eyes: No change in vision               Ears, Nose, Throat:  No change in hearing or congestion Skin: No rash or itching Cardiovascular: No chest pain, chest pressure or palpitations   Respiratory: No SOB or cough Gastrointestinal: See HPI and otherwise negative Genitourinary: No dysuria or change in urinary frequency Neurological: No headache, dizziness or syncope Musculoskeletal: No new muscle or joint  pain Hematologic: No bleeding or bruising Psychiatric: No history of depression or anxiety    Physical Exam:  Vital signs: There were no vitals taken for this visit.  Constitutional:   Pleasant Caucasian male*** appears to be in NAD, Well developed, Well nourished, alert and cooperative Head:  Normocephalic and atraumatic. Eyes:   PEERL, EOMI. No icterus. Conjunctiva pink. Ears:  Normal auditory acuity. Neck:  Supple Throat: Oral cavity and pharynx without inflammation, swelling or lesion.  Respiratory: Respirations even and unlabored. Lungs clear to auscultation bilaterally.   No wheezes, crackles, or rhonchi.  Cardiovascular: Normal S1, S2. Regular rate and rhythm. No peripheral edema, cyanosis or pallor.  Gastrointestinal:  Soft, nondistended, nontender. No rebound or guarding. Normal bowel sounds. No appreciable masses or hepatomegaly. Rectal:  Not performed.  Anoscopy: Msk:  Symmetrical without gross deformities. Without edema, no deformity or joint abnormality.  Neurologic:  Alert and  oriented x4;  grossly normal neurologically.  Skin:   Dry and intact without significant lesions or rashes. Psychiatric: Oriented to person, place and time. Demonstrates good judgement and reason without abnormal affect or behaviors.  RELEVANT LABS AND IMAGING: CBC    Latest Ref Rng & Units 12/23/2018    7:18 PM 01/11/2014    2:53 PM 06/28/2012    7:25 AM  CBC  WBC 4.0 - 10.5 K/uL 5.8  5.6  3.8   Hemoglobin 13.0 - 17.0 g/dL 96.0  45.4  09.8   Hematocrit 39.0 - 52.0 % 38.0  37.4  37.3   Platelets 150 - 400 K/uL 234  202  193      CMP     Latest Ref Rng & Units 09/13/2023   12:27 PM 12/23/2018    7:18 PM 01/11/2014    2:53 PM  CMP  Glucose 70 - 99 mg/dL 87  119  87   BUN 10 - 36 mg/dL 26  12  11    Creatinine 0.76 - 1.27 mg/dL 1.47  8.29  5.62   Sodium 134 - 144 mmol/L 137  136  137   Potassium 3.5 - 5.2 mmol/L 4.7  4.1  4.1   Chloride 96 - 106 mmol/L 100  106  100   CO2 20 - 29 mmol/L  23  23  22    Calcium 8.6 - 10.2 mg/dL 9.6  8.3  8.7   Total Protein 6.5 - 8.1 g/dL  6.7    Total Bilirubin 0.3 - 1.2 mg/dL  0.4    Alkaline Phos 38 - 126 U/L  35    AST 15 - 41 U/L  20    ALT 0 - 44 U/L  16    07/24/20 CT Angio chest IMPRESSION: 1. Stable mild fusiform aneurysmal dilation of the ascending thoracic aorta with a maximal diameter of 4.0 cm. Recommend annual imaging followup by CTA or MRA. This recommendation follows 2010 ACCF/AHA/AATS/ACR/ASA/SCA/SCAI/SIR/STS/SVM Guidelines for the Diagnosis and Management of Patients with Thoracic Aortic Disease. Circulation. 2010; 121: Z308-M578. Aortic aneurysm NOS (ICD10-I71.9); Aortic Atherosclerosis (  ICD10-I70.0). 2. Coronary artery calcifications. 3. Slight interval enlargement of a circumscribed low-attenuation cystic lesion arising at the junction of the pancreatic head and neck now measuring up to 1.5 cm in diameter compared to 1.3 cm in 2019. Given advanced age, further follow-up is optional. If additional imaging surveillance is clinically desired, consider pancreas protocol MRI of the abdomen in 6-12 months. 4. Additional ancillary findings as above.  08/26/22 CT Angio chest IMPRESSION: Grossly stable 4 cm ascending thoracic aortic aneurysm. Recommend annual imaging followup by CTA or MRA. This recommendation follows 2010 ACCF/AHA/AATS/ACR/ASA/SCA/SCAI/SIR/STS/SVM Guidelines for the Diagnosis and Management of Patients with Thoracic Aortic Disease. Circulation. 2010; 121: M841-L244. Aortic aneurysm NOS (ICD10-I71.9).   Mild coronary artery calcifications are noted.   17 mm rounded low density is seen arising anteriorly from pancreatic head which is slightly enlarged compared to prior exam. Most likely a pseudocyst. Indolent neoplasm, such as intraductal papillary mucinous tumor could look similar. Per consensus criteria, follow-up with preferably pre and post contrast abdominal MRI at 1 year is recommended. This  recommendation follows ACR consensus guidelines: Managing Incidental Findings on Abdominal CT: White Paper of the ACR Incidental Findings Committee. J Am Coll Radiol 2010;7:754-773.  Aortic Atherosclerosis (ICD10-I70.0).  08/29/23 Echo- Left ventricular ejection fraction, by estimation, is 60 to 65%.  10/14/2016 Upper endoscopy with Dr. Elnoria Howard Impression: - Food in the middle third of the esophagus. Removal was successful. - Benign- appearing esophageal stenosis. - Monilial esophagitis. - 2 cm hiatal hernia. - Normal stomach. - Normal examined duodenum.   Assessment: 1. ***  Plan: -MRI/MRCP -CMP,CBC   Thank you for the courtesy of this consult. Please call me with any questions or concerns.   Ceferino Lang, FNP-C [facility] 12/05/2023, 1:37 PM  Cc: Elder Negus, MD

## 2023-12-06 ENCOUNTER — Ambulatory Visit: Payer: Medicare PPO | Attending: Cardiology | Admitting: Cardiology

## 2023-12-06 ENCOUNTER — Ambulatory Visit (INDEPENDENT_AMBULATORY_CARE_PROVIDER_SITE_OTHER): Payer: Medicare PPO | Admitting: Gastroenterology

## 2023-12-06 ENCOUNTER — Encounter: Payer: Self-pay | Admitting: Gastroenterology

## 2023-12-06 ENCOUNTER — Other Ambulatory Visit (INDEPENDENT_AMBULATORY_CARE_PROVIDER_SITE_OTHER): Payer: Medicare PPO

## 2023-12-06 VITALS — BP 136/72 | HR 56 | Ht 72.0 in | Wt 225.8 lb

## 2023-12-06 DIAGNOSIS — K869 Disease of pancreas, unspecified: Secondary | ICD-10-CM | POA: Diagnosis not present

## 2023-12-06 LAB — CBC WITH DIFFERENTIAL/PLATELET
Basophils Absolute: 0 10*3/uL (ref 0.0–0.1)
Basophils Relative: 0.8 % (ref 0.0–3.0)
Eosinophils Absolute: 0.1 10*3/uL (ref 0.0–0.7)
Eosinophils Relative: 2.1 % (ref 0.0–5.0)
HCT: 37.8 % — ABNORMAL LOW (ref 39.0–52.0)
Hemoglobin: 13.1 g/dL (ref 13.0–17.0)
Lymphocytes Relative: 54 % — ABNORMAL HIGH (ref 12.0–46.0)
Lymphs Abs: 2.1 10*3/uL (ref 0.7–4.0)
MCHC: 34.7 g/dL (ref 30.0–36.0)
MCV: 89 fL (ref 78.0–100.0)
Monocytes Absolute: 0.3 10*3/uL (ref 0.1–1.0)
Monocytes Relative: 8 % (ref 3.0–12.0)
Neutro Abs: 1.4 10*3/uL (ref 1.4–7.7)
Neutrophils Relative %: 35.1 % — ABNORMAL LOW (ref 43.0–77.0)
Platelets: 198 10*3/uL (ref 150.0–400.0)
RBC: 4.25 Mil/uL (ref 4.22–5.81)
RDW: 14.2 % (ref 11.5–15.5)
WBC: 3.9 10*3/uL — ABNORMAL LOW (ref 4.0–10.5)

## 2023-12-06 LAB — COMPREHENSIVE METABOLIC PANEL
ALT: 13 U/L (ref 0–53)
AST: 19 U/L (ref 0–37)
Albumin: 4.1 g/dL (ref 3.5–5.2)
Alkaline Phosphatase: 36 U/L — ABNORMAL LOW (ref 39–117)
BUN: 15 mg/dL (ref 6–23)
CO2: 28 meq/L (ref 19–32)
Calcium: 9 mg/dL (ref 8.4–10.5)
Chloride: 105 meq/L (ref 96–112)
Creatinine, Ser: 0.98 mg/dL (ref 0.40–1.50)
GFR: 67.76 mL/min (ref >60.00)
Glucose, Bld: 82 mg/dL (ref 70–99)
Potassium: 4.2 meq/L (ref 3.5–5.1)
Sodium: 140 meq/L (ref 135–145)
Total Bilirubin: 0.5 mg/dL (ref 0.2–1.2)
Total Protein: 7 g/dL (ref 6.0–8.3)

## 2023-12-06 NOTE — Patient Instructions (Signed)
 You have been scheduled for an MRI at [facility] on 12/23/23. Your appointment time is 9:00 am. Please arrive to admitting (at main entrance of the hospital) 30 minutes prior to your appointment time for registration purposes. Please make certain not to have anything to eat or drink 4 hours prior to your test. In addition, if you have any metal in your body, have a pacemaker or defibrillator, please be sure to let your ordering physician know. This test typically takes 45 minutes to 1 hour to complete. Should you need to reschedule, please call [phone] to do so.  Your provider has requested that you go to the basement level for lab work before leaving today. Press "B" on the elevator. The lab is located at the first door on the left as you exit the elevator.  Due to recent changes in healthcare laws, you may see the results of your imaging and laboratory studies on MyChart before your provider has had a chance to review them.  We understand that in some cases there may be results that are confusing or concerning to you. Not all laboratory results come back in the same time frame and the provider may be waiting for multiple results in order to interpret others.  Please give [place] 48 hours in order for your provider to thoroughly review all the results before contacting the office for clarification of your results.   It was a pleasure to see you today!  Thank you for trusting me with your gastrointestinal care!

## 2023-12-06 NOTE — Progress Notes (Deleted)
  Cardiology Office Note:  .   Date:  12/06/2023  ID:  Stephen Hawkins, DOB 10/07/1931, MRN [id number] PCP: Porfirio Oar, PA  [facility] Providers Cardiologist:  Truett Mainland, MD PCP: Porfirio Oar, PA  No chief complaint on file.     History of Present Illness: .    Stephen Hawkins is a 89 y.o. male with hypertension. asymptomatic thoracic aorta dilatation and grade II AI  ***  There were no vitals filed for this visit.   ROS: *** ROS   Studies Reviewed: .        *** Independently interpreted ***/202***: Chol ***, TG ***, HDL ***, LDL *** HbA1C ***% Hb *** Cr *** ***  Risk Assessment/Calculations:   {Does this patient have ATRIAL FIBRILLATION?[phone]}     Physical Exam:   Physical Exam   VISIT DIAGNOSES: No diagnosis found.   ASSESSMENT AND PLAN: .    Stephen Hawkins is a 89 y.o. male with hypertension. asymptomatic thoracic aorta dilatation and grade II AI.    *** Dyspnea on exertion, leg edema: Patient is quite hypertensive today. Differentials include uncontrolled hypertension, heart failure, worsening AI. Will check echocardiogram today. Unfortunately, he remains quite reluctant to start any medications today.    *** Pancreatic lesion:  Incidental finding on CTA chest in 08/2022. Previously was not sure about GI referral.  He did see GI Dr. Meridee Score recently, he was recommended MR.***   *** Aortic root dilatation: Given his age and overall historical stability, we discontinued further serial monitoring of his aorta sizes in 08/2022.  {Are you ordering a CV Procedure (e.g. stress test, cath, DCCV, TEE, etc)?   Press F2        :[id number]}    No orders of the defined types were placed in this encounter.    F/u in ***  Signed, Elder Negus, MD

## 2023-12-06 NOTE — Progress Notes (Signed)
 Attending Physician's Attestation   I have reviewed the chart.   I agree with the Advanced Practitioner's note, impression, and recommendations with any updates as below. I think an updated imaging study, and this individual will hopefully be able to show stability of the lesion without further changes.  There would need to be a significant increase in size to consider the role of an endoscopic ultrasound.  If there is a change then consideration of a CA 19-9 being sent to see if it is elevated or not to further consider role of endoscopic ultrasound can be had versus discontinuing surveillance.   Corliss Parish, MD [facility] Advanced Endoscopy Office # [phone]

## 2023-12-07 ENCOUNTER — Encounter: Payer: Self-pay | Admitting: Cardiology

## 2023-12-23 ENCOUNTER — Other Ambulatory Visit: Payer: Self-pay | Admitting: Gastroenterology

## 2023-12-23 ENCOUNTER — Ambulatory Visit (HOSPITAL_COMMUNITY)
Admission: RE | Admit: 2023-12-23 | Discharge: 2023-12-23 | Disposition: A | Discharge status: Home / Self Care | Payer: Medicare PPO | Source: Ambulatory Visit | Attending: Gastroenterology | Admitting: Gastroenterology

## 2023-12-23 DIAGNOSIS — K869 Disease of pancreas, unspecified: Secondary | ICD-10-CM | POA: Insufficient documentation

## 2023-12-23 MED ORDER — GADOBUTROL 1 MMOL/ML IV SOLN
10.0000 mL | Freq: Once | INTRAVENOUS | Status: AC | PRN
  Administered 2023-12-23: 10 mL via INTRAVENOUS

## 2023-12-26 ENCOUNTER — Telehealth: Payer: Self-pay | Admitting: Gastroenterology

## 2023-12-26 NOTE — Telephone Encounter (Signed)
 Called patient to discuss findings on MRI/MRCP.  Discussed with him Dr. Elesa Hacker recommendations for lab work to check CA 19-9 .  Patient did not wish to pursue any lab work or further testing at this time.  He did agree to a follow-up image in 1 year.  Will place recall.

## 2023-12-27 ENCOUNTER — Telehealth: Payer: Self-pay

## 2023-12-27 NOTE — Telephone Encounter (Signed)
 MRI/MRCP in 1 year from most recent for IPMN/pancreatic lesion - reminder message sent to [facility].

## 2023-12-27 NOTE — Telephone Encounter (Signed)
-----   Message from Margarite Gouge May sent at 12/26/2023  4:03 PM EDT ----- Regarding: FW: imaging results Good afternoon, Can we please place recall for MRI/MRCP in 1 year from most recent for IPMN/pancreatic lesion.  Thank you, Deanna ----- Message ----- From: Lemar Lofty., MD Sent: 12/26/2023  11:23 AM EDT To: Margarite Gouge May, NP; Ottie Glazier, MD Subject: RE: imaging results                            Team, The MRI/MRCP is as I would expect. Depending on how aggressive the patient/family want to be, I think sending a CA 19-9 is not unreasonable. If it is negative, the most I would think about doing for this patient at his age would be to consider a repeat MRI/MRCP in 1 year. If it is negative, it is also reasonable to stop surveillance in the setting of his age. If the CA 19-9 is elevated, we can pursue an endoscopic ultrasound versus repeat MRI/MRCP in 6 months with repeat CA 19-9. Let me know where things go after you discussed with the patient and what the CA 19-9 level is. Thanks. GM ----- Message ----- From: May, Deanna J, NP Sent: 12/26/2023   9:25 AM EDT To: Lemar Lofty., MD; # Subject: imaging results                                Results attached.  Jennette Kettle, NP ----- Message ----- From: Interface, Rad Results In Sent: 12/25/2023   8:32 PM EDT To: Margarite Gouge May, NP
